# Patient Record
Sex: Male | Born: 1985
Health system: Southern US, Community
[De-identification: ages and names within clinical notes are randomized; demographics above are authoritative.]

## PROBLEM LIST (undated history)

## (undated) DIAGNOSIS — F329 Major depressive disorder, single episode, unspecified: Secondary | ICD-10-CM

## (undated) DIAGNOSIS — Z720 Tobacco use: Secondary | ICD-10-CM

## (undated) DIAGNOSIS — F32A Depression, unspecified: Secondary | ICD-10-CM

## (undated) DIAGNOSIS — M1990 Unspecified osteoarthritis, unspecified site: Secondary | ICD-10-CM

## (undated) DIAGNOSIS — G4700 Insomnia, unspecified: Secondary | ICD-10-CM

## (undated) HISTORY — DX: Major depressive disorder, single episode, unspecified: F32.9

## (undated) HISTORY — DX: Insomnia, unspecified: G47.00

## (undated) HISTORY — DX: Unspecified osteoarthritis, unspecified site: M19.90

## (undated) HISTORY — DX: Tobacco use: Z72.0

## (undated) HISTORY — DX: Depression, unspecified: F32.A

---

## 2017-01-14 ENCOUNTER — Encounter: Payer: Self-pay | Admitting: Primary Care

## 2017-01-14 ENCOUNTER — Encounter (INDEPENDENT_AMBULATORY_CARE_PROVIDER_SITE_OTHER): Payer: Self-pay

## 2017-01-14 ENCOUNTER — Ambulatory Visit (INDEPENDENT_AMBULATORY_CARE_PROVIDER_SITE_OTHER): Payer: BLUE CROSS/BLUE SHIELD | Admitting: Primary Care

## 2017-01-14 VITALS — BP 122/82 | HR 69 | Temp 98.3°F | Ht 69.75 in | Wt 172.1 lb

## 2017-01-14 DIAGNOSIS — F419 Anxiety disorder, unspecified: Secondary | ICD-10-CM | POA: Diagnosis not present

## 2017-01-14 DIAGNOSIS — M2550 Pain in unspecified joint: Secondary | ICD-10-CM | POA: Insufficient documentation

## 2017-01-14 DIAGNOSIS — G8929 Other chronic pain: Secondary | ICD-10-CM | POA: Diagnosis not present

## 2017-01-14 DIAGNOSIS — F32A Depression, unspecified: Secondary | ICD-10-CM | POA: Insufficient documentation

## 2017-01-14 DIAGNOSIS — M5440 Lumbago with sciatica, unspecified side: Secondary | ICD-10-CM | POA: Diagnosis not present

## 2017-01-14 DIAGNOSIS — F329 Major depressive disorder, single episode, unspecified: Secondary | ICD-10-CM

## 2017-01-14 NOTE — Patient Instructions (Addendum)
Stop by the front desk and speak with either Shirlee Limerick or Zella Ball regarding your MRI.  Continue to alternate between Ibuprofen and Tylenol as needed for pain.  I will be in touch with you once I receive your MRI results.  It was a pleasure to meet you today! Please don't hesitate to call me with any questions. Welcome to [facility]!

## 2017-01-14 NOTE — Assessment & Plan Note (Signed)
[facility], managed on Lexapro through [place]. He is scheduled for follow up in November 2018.  Denies SI/HI.

## 2017-01-14 NOTE — Assessment & Plan Note (Signed)
Present for months, xray unremarkable per [place], no improvement with physical therapy. Exam today concerning for weakness/foot drop to right lower extremity. Will obtain MRI for further evaluation, consider neurosurgery referral.

## 2017-01-14 NOTE — Progress Notes (Signed)
Subjective:    Patient ID: Steve Lopez, male    DOB: 1986/01/05, 31 y.o.   MRN: [id number]  HPI  Steve Lopez is a 31 year old male who presents today to establish care and discuss the problems mentioned below. Will obtain old records. He follows with the [place].   1) Anxiety and Depression: Also with Insomnia. Diagnosed in Lopez 2017. Currently managed on Lexapro 10 mg. He has difficulty falling and staying asleep. He's taking Melatonin 10 mg at bedtime and will typically wake up four hours later. He will sleep better when he misses a dose of his Lexapro. He is currently following with the [place].  2) Chronic Back Pain: Decrease ROM and pain when rising from a sitting position. It takes him seveaal seconds to minutes to improve mobility. He also experiences bilateral low back pain with radiation to his lower extremities, intermittent numbness. He had xray's of his lower back through the [place] which were unremarkable. He is currently undergoing physical therapy for which he began in Lopez 2017. He's noticed minimal improvement with physical therapy. His pain is worse with activity, working outdoors, and during his occupation. He has noticed weakness to where he has difficulty picking up his right ankle and foot when walking.  3) Knee Pain: Located to bilateral knees that have been present for the past 8-10 years. Pain is worse with activity. He has chronic crepitus. He takes tylenol/ibuprofen three times daily for pain, 3-4 days weekly on average. He has noticed weakness to his left knee. He denies recent injury/trauma.  4) Ankle Pain: Located to the right posterior ankle with radiation to anterior side. This is intermittent since rolling his ankle in 2006. He has noticed weakness to where he cannot pick up his foot on occasion. He denies recent injury/trauma.  5) Shoulder Pain: Located to his left shoulder since exercising in 2007. He experiences intermittent decrease in ROM and pain. He denies  numbness/tingling to the left shoulder. He denies prior dislocation.   6) Elbow Pain: Crepitus to left elbow with numbness that originates to the left elbow that will radiate to the finger tips. This has been intermittent daily since 2008.   Review of Systems  Constitutional: Negative for unexpected weight change.  Respiratory: Negative for shortness of breath.   Cardiovascular: Negative for chest pain.  Musculoskeletal: Positive for arthralgias and back pain.  Skin: Negative for color change.  Neurological: Positive for weakness and numbness.  Psychiatric/Behavioral: Positive for sleep disturbance. Negative for suicidal ideas. The patient is not nervous/anxious.        Past Medical History:  Diagnosis Date  . Arthritis   . Depression   . Insomnia      Social History   Social History  . Marital status: Married    Spouse name: N/A  . Number of children: N/A  . Years of education: N/A   Occupational History  . Not on file.   Social History Main Topics  . Smoking status: Former Smoker    Types: Cigarettes  . Smokeless tobacco: Never Used  . Alcohol use No  . Drug use: No  . Sexual activity: Not on file   Other Topics Concern  . Not on file   Social History Narrative   Soon to be married.   Three children.   Works as a Armed forces training and education officer.    Enjoys spending time outdoors, working on cars.    No past surgical history on file.  No family history on file.  No Known Allergies  No current outpatient prescriptions on file prior to visit.   No current facility-administered medications on file prior to visit.     BP 122/82   Pulse 69   Temp 98.3 F (36.8 C) (Oral)   Ht 5' 9.75" (1.772 m)   Wt 172 lb 1.9 oz (78.1 kg)   SpO2 98%   BMI 24.87 kg/m    Objective:   Physical Exam  Constitutional: He appears well-nourished.  Neck: Neck supple.  Cardiovascular: Normal rate and regular rhythm.   Pulmonary/Chest: Effort normal and breath sounds normal.    Musculoskeletal:       Left shoulder: He exhibits crepitus. He exhibits normal range of motion, no tenderness and no pain.       Left elbow: He exhibits normal range of motion and no deformity. No tenderness found.       Right knee: He exhibits normal range of motion and no bony tenderness.       Left knee: He exhibits normal range of motion and no bony tenderness.       Right ankle: He exhibits normal range of motion. No tenderness.       Lumbar back: He exhibits decreased range of motion and pain. He exhibits no tenderness and no bony tenderness.  Weakness to right foot with plantar/dorsal flexion and extension. 4/5.  Skin: Skin is warm and dry.  Psychiatric: He has a normal mood and affect.          Assessment & Plan:

## 2017-01-14 NOTE — Assessment & Plan Note (Addendum)
Chronic for 8-10 years. Located to knees, right ankle, left elbow, left shoulder. Exam stable. Continue physical therapy through the [place]. Continue tylenol/ibuprofen PRN. Consider ortho evaluation in the future.

## 2017-01-14 NOTE — Progress Notes (Signed)
Pre visit review using our clinic review tool, if applicable. No additional management support is needed unless otherwise documented below in the visit note. 

## 2017-01-23 ENCOUNTER — Ambulatory Visit
Admission: RE | Admit: 2017-01-23 | Discharge: 2017-01-23 | Disposition: A | Discharge status: Home / Self Care | Payer: BLUE CROSS/BLUE SHIELD | Source: Ambulatory Visit | Attending: Primary Care | Admitting: Primary Care

## 2017-01-23 DIAGNOSIS — M5126 Other intervertebral disc displacement, lumbar region: Secondary | ICD-10-CM | POA: Insufficient documentation

## 2017-01-23 DIAGNOSIS — M47896 Other spondylosis, lumbar region: Secondary | ICD-10-CM | POA: Diagnosis not present

## 2017-01-23 DIAGNOSIS — M545 Low back pain: Secondary | ICD-10-CM | POA: Diagnosis not present

## 2017-01-23 DIAGNOSIS — G8929 Other chronic pain: Secondary | ICD-10-CM | POA: Diagnosis not present

## 2017-01-23 DIAGNOSIS — M5440 Lumbago with sciatica, unspecified side: Secondary | ICD-10-CM | POA: Insufficient documentation

## 2017-05-28 DIAGNOSIS — H52223 Regular astigmatism, bilateral: Secondary | ICD-10-CM | POA: Diagnosis not present

## 2017-05-28 DIAGNOSIS — H5213 Myopia, bilateral: Secondary | ICD-10-CM | POA: Diagnosis not present

## 2017-06-07 ENCOUNTER — Other Ambulatory Visit: Payer: Self-pay | Admitting: Primary Care

## 2017-06-07 DIAGNOSIS — Z0000 Encounter for general adult medical examination without abnormal findings: Secondary | ICD-10-CM

## 2017-06-10 ENCOUNTER — Other Ambulatory Visit (INDEPENDENT_AMBULATORY_CARE_PROVIDER_SITE_OTHER): Payer: BLUE CROSS/BLUE SHIELD

## 2017-06-10 DIAGNOSIS — Z0000 Encounter for general adult medical examination without abnormal findings: Secondary | ICD-10-CM | POA: Diagnosis not present

## 2017-06-10 LAB — LIPID PANEL
CHOL/HDL RATIO: 4
Cholesterol: 120 mg/dL (ref 0–200)
HDL: 27.7 mg/dL — ABNORMAL LOW (ref >39.00)
LDL Cholesterol: 62 mg/dL (ref 0–99)
NONHDL: 92.24
Triglycerides: 153 mg/dL — ABNORMAL HIGH (ref 0.0–149.0)
VLDL: 30.6 mg/dL (ref 0.0–40.0)

## 2017-06-10 LAB — COMPREHENSIVE METABOLIC PANEL
ALK PHOS: 51 U/L (ref 39–117)
ALT: 42 U/L (ref 0–53)
AST: 20 U/L (ref 0–37)
Albumin: 4.7 g/dL (ref 3.5–5.2)
BILIRUBIN TOTAL: 0.4 mg/dL (ref 0.2–1.2)
BUN: 12 mg/dL (ref 6–23)
CO2: 28 meq/L (ref 19–32)
Calcium: 9.7 mg/dL (ref 8.4–10.5)
Chloride: 106 mEq/L (ref 96–112)
Creatinine, Ser: 0.88 mg/dL (ref 0.40–1.50)
GFR: 107.59 mL/min (ref >60.00)
GLUCOSE: 75 mg/dL (ref 70–99)
Potassium: 3.5 mEq/L (ref 3.5–5.1)
SODIUM: 142 meq/L (ref 135–145)
TOTAL PROTEIN: 7.3 g/dL (ref 6.0–8.3)

## 2017-06-11 ENCOUNTER — Ambulatory Visit (INDEPENDENT_AMBULATORY_CARE_PROVIDER_SITE_OTHER): Payer: BLUE CROSS/BLUE SHIELD | Admitting: Primary Care

## 2017-06-11 ENCOUNTER — Encounter: Payer: Self-pay | Admitting: Primary Care

## 2017-06-11 VITALS — BP 120/78 | HR 79 | Temp 97.9°F | Ht 69.75 in | Wt 169.0 lb

## 2017-06-11 DIAGNOSIS — Z0000 Encounter for general adult medical examination without abnormal findings: Secondary | ICD-10-CM

## 2017-06-11 DIAGNOSIS — F419 Anxiety disorder, unspecified: Secondary | ICD-10-CM | POA: Diagnosis not present

## 2017-06-11 DIAGNOSIS — F329 Major depressive disorder, single episode, unspecified: Secondary | ICD-10-CM

## 2017-06-11 DIAGNOSIS — F32A Depression, unspecified: Secondary | ICD-10-CM

## 2017-06-11 NOTE — Assessment & Plan Note (Signed)
Td UTD per patient via [place]. Declines influenza vaccination. Discussed to increase vegetables, fruit, whole grains. Discussed to start exercising. Exam unremarkable. Labs stable.  Follow up in 1 year.

## 2017-06-11 NOTE — Assessment & Plan Note (Signed)
No Lexapro since July 2018, doing well off. Continue to monitor. Managed on Ambien now for insomnia due to occupation as he works night shift. Managed per the [place].

## 2017-06-11 NOTE — Patient Instructions (Signed)
Start exercising. You should be getting 150 minutes of moderate intensity exercise weekly.  Increase consumption of vegetables, fruit, whole grains.  Ensure you are consuming 64 ounces of water daily.  Follow up in 1 year for your annual exam or sooner if needed.  It was a pleasure to see you today!

## 2017-06-11 NOTE — Progress Notes (Signed)
Subjective:    Patient ID: Steve Lopez, male    DOB: [birth year], 31 y.o.   MRN: [id number]  HPI  Steve Lopez is a 31 year old male who presents today for complete physical.  Immunizations: -Tetanus: Believes it's been within the last 10 years. -Influenza: Declines   Diet: He endorse a healthy diet. Breakfast: Protein shake Lunch: Left overs  Dinner: Chicken, pasta, meat, vegetables  Snacks: None Desserts: None Beverages: Green tea, low calorie Gatorade, water  Exercise: He does not currently exercise. Eye exam: Completed in September 2018 Dental exam: Due tomorrow.   Review of Systems  Constitutional: Negative for unexpected weight change.  HENT: Negative for rhinorrhea.   Respiratory: Negative for cough and shortness of breath.   Cardiovascular: Negative for chest pain.  Gastrointestinal: Negative for constipation and diarrhea.  Genitourinary: Negative for difficulty urinating.  Musculoskeletal: Negative for arthralgias and myalgias.  Skin: Negative for rash.  Allergic/Immunologic: Negative for environmental allergies.  Neurological: Negative for dizziness, numbness and headaches.  Psychiatric/Behavioral:       Stopped Lexapro in July 2018, doing well off of Lexapro.        Past Medical History:  Diagnosis Date  . Arthritis   . Depression   . Insomnia      Social History   Social History  . Marital status: Married    Spouse name: N/A  . Number of children: N/A  . Years of education: N/A   Occupational History  . Not on file.   Social History Main Topics  . Smoking status: Former Smoker    Types: Cigarettes  . Smokeless tobacco: Never Used  . Alcohol use No  . Drug use: No  . Sexual activity: Not on file   Other Topics Concern  . Not on file   Social History Narrative   Soon to be married.   Three children.   Works as a Armed forces training and education officer.    Enjoys spending time outdoors, working on cars.    No past surgical history on file.  No  family history on file.  No Known Allergies  No current outpatient prescriptions on file prior to visit.   No current facility-administered medications on file prior to visit.     BP 120/78   Pulse 79   Temp 97.9 F (36.6 C) (Oral)   Ht 5' 9.75" (1.772 m)   Wt 169 lb (76.7 kg)   SpO2 97%   BMI 24.42 kg/m    Objective:   Physical Exam  Constitutional: He is oriented to person, place, and time. He appears well-nourished.  HENT:  Right Ear: Tympanic membrane and ear canal normal.  Left Ear: Tympanic membrane and ear canal normal.  Nose: Nose normal. Right sinus exhibits no maxillary sinus tenderness and no frontal sinus tenderness. Left sinus exhibits no maxillary sinus tenderness and no frontal sinus tenderness.  Mouth/Throat: Oropharynx is clear and moist.  Eyes: Pupils are equal, round, and reactive to light. Conjunctivae and EOM are normal.  Neck: Neck supple. Carotid bruit is not present. No thyromegaly present.  Cardiovascular: Normal rate, regular rhythm and normal heart sounds.   Pulmonary/Chest: Effort normal and breath sounds normal. He has no wheezes. He has no rales.  Abdominal: Soft. Bowel sounds are normal. There is no tenderness.  Musculoskeletal: Normal range of motion.  Neurological: He is alert and oriented to person, place, and time. He has normal reflexes. No cranial nerve deficit.  Skin: Skin is warm and dry.  Psychiatric: He has  a normal mood and affect.          Assessment & Plan:

## 2018-03-10 IMAGING — MR MR LUMBAR SPINE W/O CM
4 of 5 series · 15 of 48 positions shown · non-contrast
Comparison: None.

CLINICAL DATA: Low back pain and bilateral leg pain with numbness
and tingling. Symptoms of 10 years duration.

EXAM:
MRI LUMBAR SPINE WITHOUT CONTRAST
TECHNIQUE: Multiplanar, multisequence MR imaging of the lumbar spine was
performed. No intravenous contrast was administered.

[Series 2: T2 · sagittal · 4.0mm · 0.44mm/px · 6 of 15 slices shown (1 of 2)]
[im 1/15]
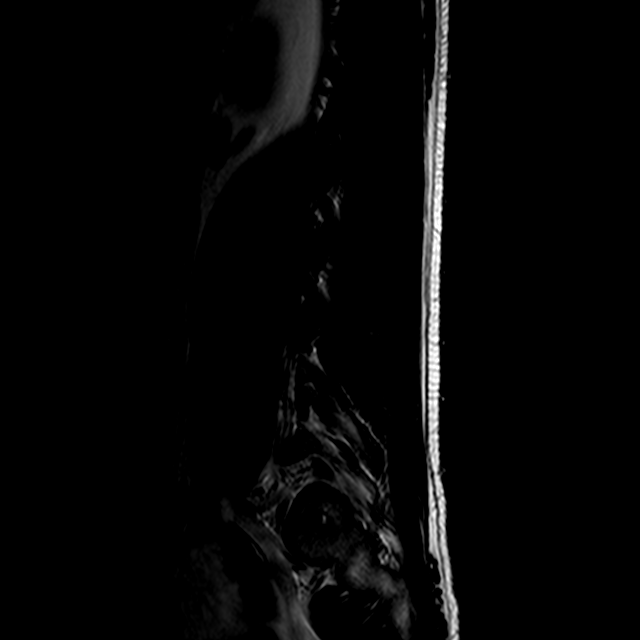
[im 3/15]
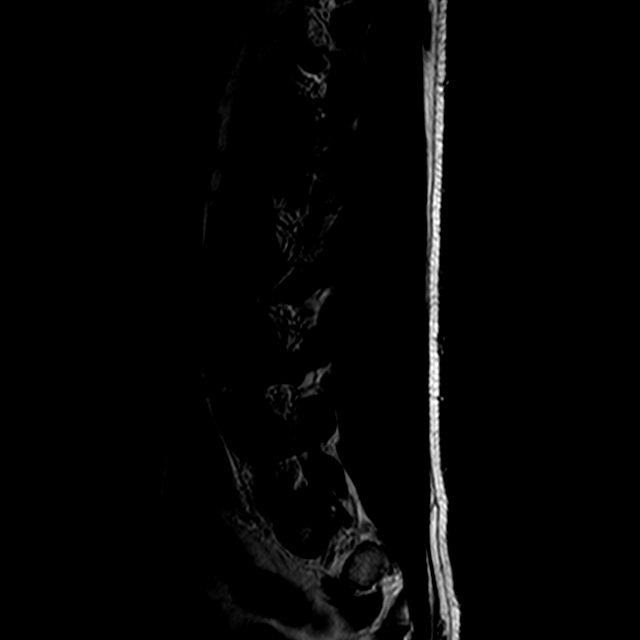
[im 6/15]
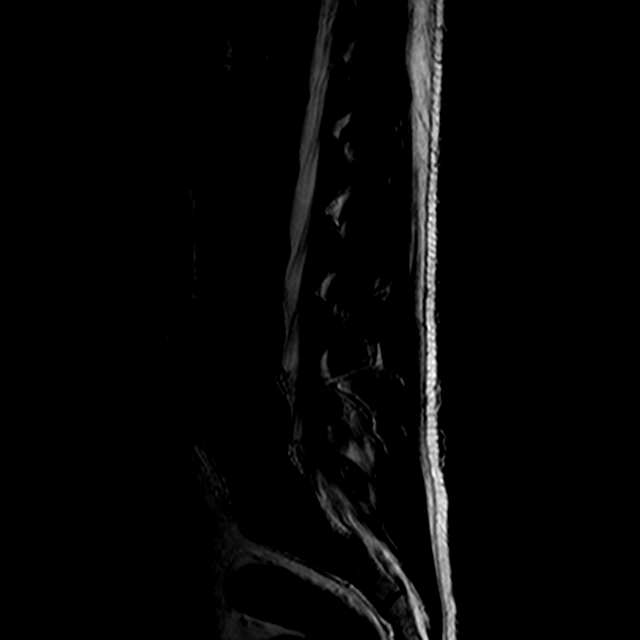
[im 9/15]
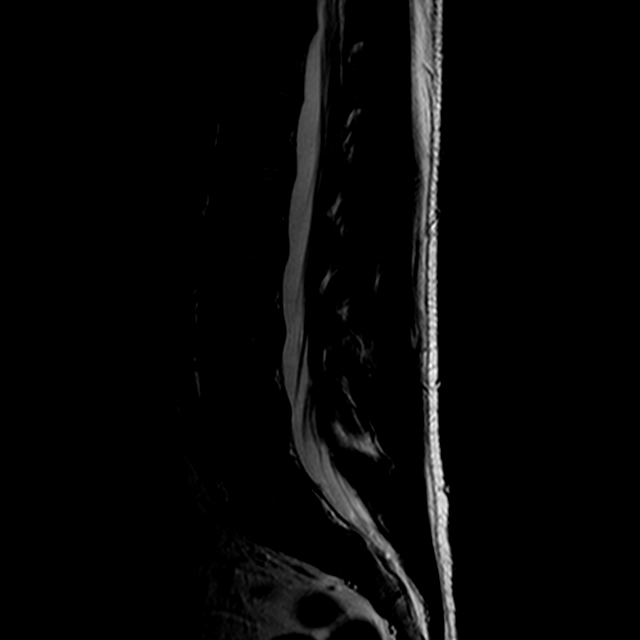
[im 12/15]
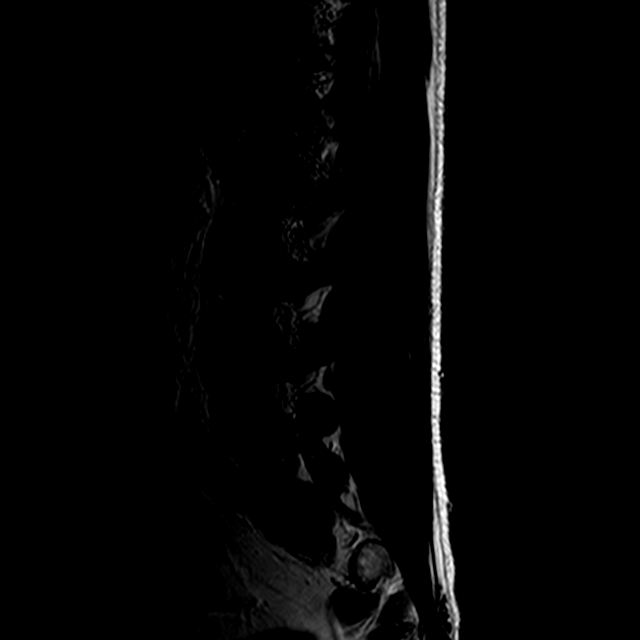
[im 15/15]
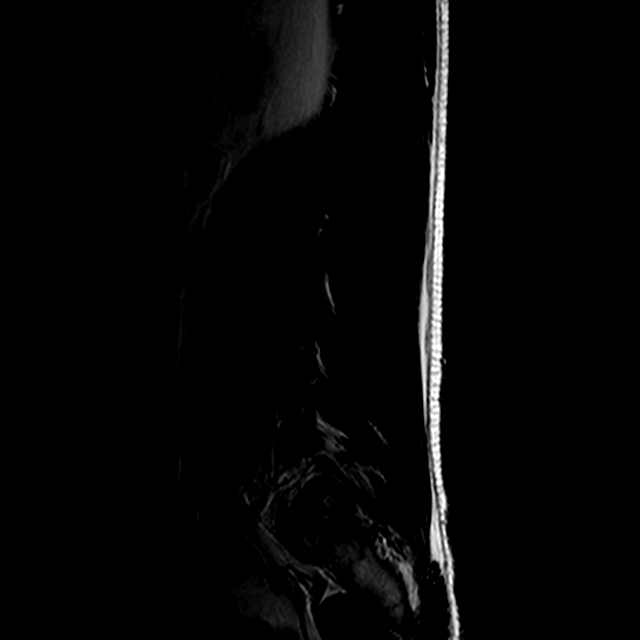

[Series 3: T1 · sagittal · 4.0mm · 0.44mm/px · 3 of 15 slices shown (1 of 2)]
[im 3/15]
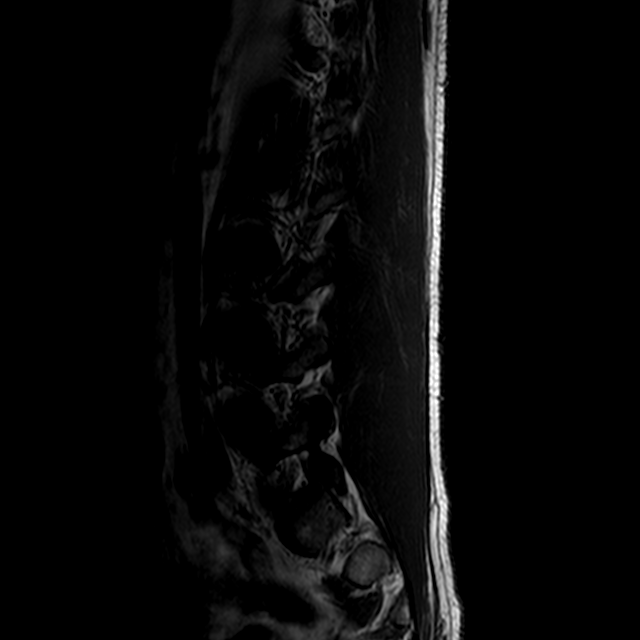
[im 9/15]
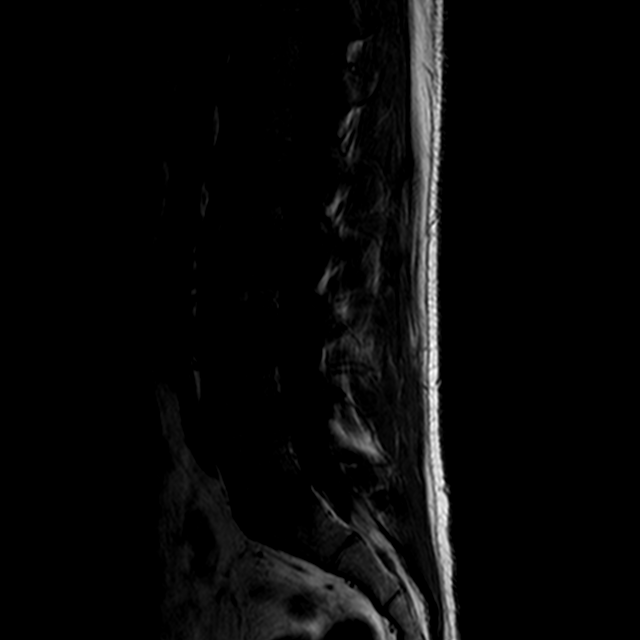
[im 15/15]
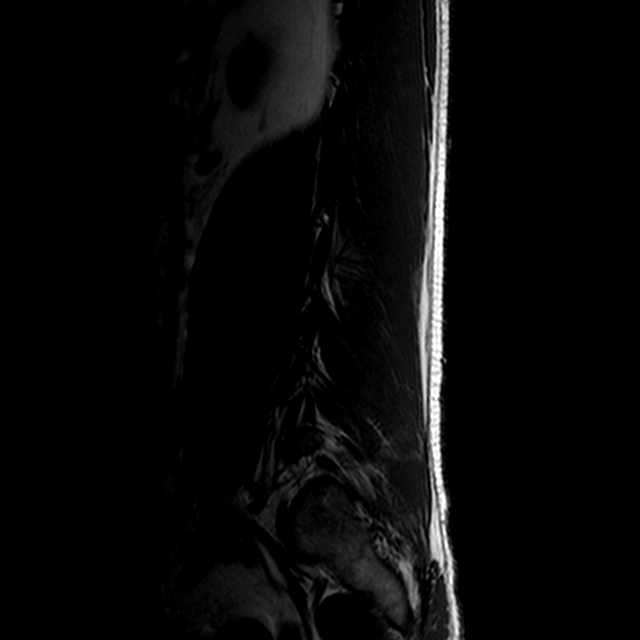

[Series 5: T2 · axial · 4.0mm · 0.39mm/px · z∈[-78,+89]mm · 3 of 39 slices shown (2 of 2)]
[im 6/39]
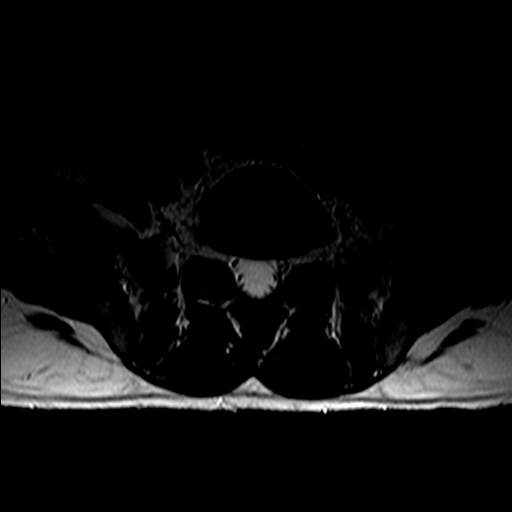
[im 20/39]
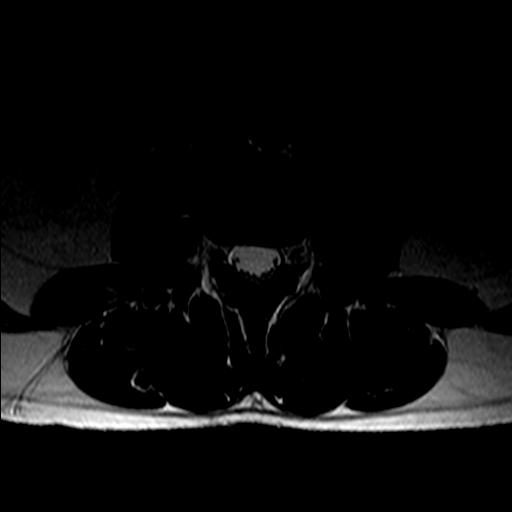
[im 33/39]
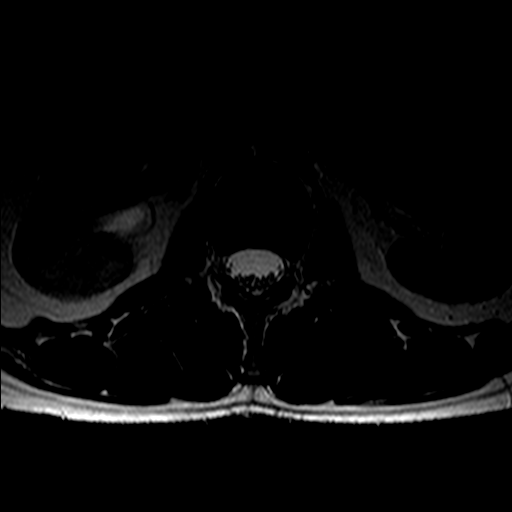

[Series 6: T1 · axial · 4.0mm · 0.39mm/px · z∈[-78,+89]mm · 3 of 39 slices shown (2 of 2)]
[im 6/39]
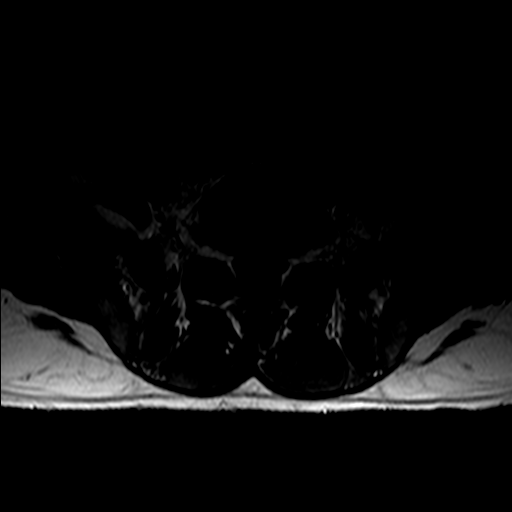
[im 20/39]
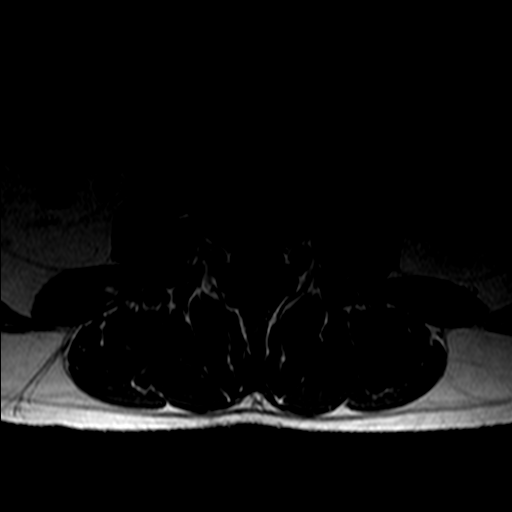
[im 33/39]
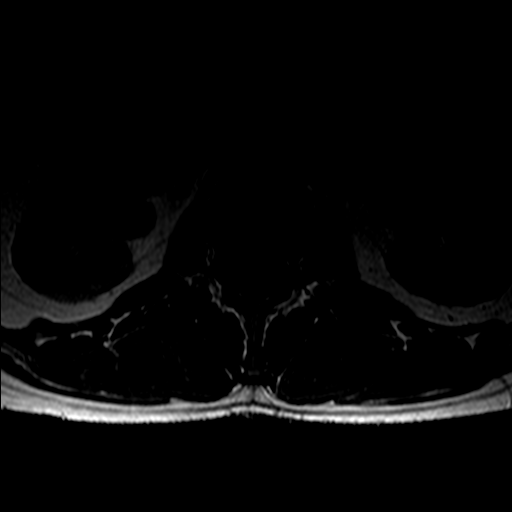

[15 of 48 positions shown; findings below may reference images not displayed]

FINDINGS: Segmentation: 5 lumbar type vertebral bodies. L5 has some
transitional features.

Alignment:  Normal

Vertebrae:  Normal

Conus medullaris: Extends to the L1 level and appears normal.

Paraspinal and other soft tissues: Normal

Disc levels:

T11-12: Minimal bulging of the disc. No stenosis or neural
compression.

T12-L1 through L3-4. Normal. No disc pathology. No facet
arthropathy. No stenosis.

L4-5: Very minimal bulging of the disc. Minimal facet
osteoarthritis. No compressive stenosis.

L5-S1: Somewhat rudimentary disc space. Normal signal
characteristics and morphology. No stenosis.
IMPRESSION: L5 has some transitional features.

Very minimal degenerative changes at L4-5 with minimal bulging of
the disc and minimal facet osteoarthritis. No compressive stenosis.
Often, findings of this degree will be asymptomatic, but it is
possible they could contribute to low back pain. No evidence of
neural compression.

## 2018-04-28 ENCOUNTER — Ambulatory Visit: Payer: BLUE CROSS/BLUE SHIELD | Admitting: Primary Care

## 2018-04-28 ENCOUNTER — Encounter: Payer: Self-pay | Admitting: Primary Care

## 2018-04-28 VITALS — BP 122/84 | HR 72 | Temp 98.0°F | Ht 69.75 in | Wt 172.2 lb

## 2018-04-28 DIAGNOSIS — G8929 Other chronic pain: Secondary | ICD-10-CM

## 2018-04-28 DIAGNOSIS — Z720 Tobacco use: Secondary | ICD-10-CM | POA: Diagnosis not present

## 2018-04-28 DIAGNOSIS — F329 Major depressive disorder, single episode, unspecified: Secondary | ICD-10-CM

## 2018-04-28 DIAGNOSIS — F419 Anxiety disorder, unspecified: Secondary | ICD-10-CM | POA: Diagnosis not present

## 2018-04-28 DIAGNOSIS — M5440 Lumbago with sciatica, unspecified side: Secondary | ICD-10-CM | POA: Diagnosis not present

## 2018-04-28 DIAGNOSIS — F32A Depression, unspecified: Secondary | ICD-10-CM

## 2018-04-28 DIAGNOSIS — Z0000 Encounter for general adult medical examination without abnormal findings: Secondary | ICD-10-CM

## 2018-04-28 LAB — COMPREHENSIVE METABOLIC PANEL
ALBUMIN: 4.7 g/dL (ref 3.5–5.2)
ALT: 82 U/L — ABNORMAL HIGH (ref 0–53)
AST: 33 U/L (ref 0–37)
Alkaline Phosphatase: 69 U/L (ref 39–117)
BUN: 14 mg/dL (ref 6–23)
CALCIUM: 10 mg/dL (ref 8.4–10.5)
CO2: 31 meq/L (ref 19–32)
CREATININE: 1.08 mg/dL (ref 0.40–1.50)
Chloride: 102 mEq/L (ref 96–112)
GFR: 84.46 mL/min (ref >60.00)
Glucose, Bld: 100 mg/dL — ABNORMAL HIGH (ref 70–99)
POTASSIUM: 4.5 meq/L (ref 3.5–5.1)
SODIUM: 138 meq/L (ref 135–145)
Total Bilirubin: 0.6 mg/dL (ref 0.2–1.2)
Total Protein: 7.6 g/dL (ref 6.0–8.3)

## 2018-04-28 LAB — LIPID PANEL
CHOL/HDL RATIO: 5
CHOLESTEROL: 123 mg/dL (ref 0–200)
HDL: 25.4 mg/dL — ABNORMAL LOW (ref >39.00)
NONHDL: 97.25
TRIGLYCERIDES: 248 mg/dL — AB (ref 0.0–149.0)
VLDL: 49.6 mg/dL — AB (ref 0.0–40.0)

## 2018-04-28 LAB — LDL CHOLESTEROL, DIRECT: Direct LDL: 88 mg/dL

## 2018-04-28 MED ORDER — VARENICLINE TARTRATE 1 MG PO TABS: 1.0000 mg | ORAL_TABLET | Freq: Two times a day (BID) | ORAL | 0 refills | Status: DC

## 2018-04-28 MED ORDER — VARENICLINE TARTRATE 0.5 MG X 11 & 1 MG X 42 PO MISC: ORAL | 0 refills | Status: DC

## 2018-04-28 NOTE — Assessment & Plan Note (Signed)
Participating in PT for knee and shoulder, following with [facility].

## 2018-04-28 NOTE — Assessment & Plan Note (Signed)
Once on cigarettes, now vaping.  Will trial Chantix as he's been successful on this in the past. No interaction with Tofranil.   Will send starting and continuing packs to pharmacy, he will update once he's towards then end of his continuing pack.

## 2018-04-28 NOTE — Progress Notes (Signed)
Subjective:    Patient ID: Steve Lopez, male    DOB: 25-Dec-1985, 32 y.o.   MRN: [id number]  HPI  Steve Lopez is a 32 year old male who presents today for complete physical.  Currently vaping 2 cartriges within 12 hour shifts, doing this daily. He has tried weaning down on his dose but has not been successful. He's wanting to quit and needs help. He's tried nicotine patches without improvement. He has tried Chantix for cigarettes in the past and was successful.  Immunizations: -Tetanus: Completed within 10 years.  -Influenza: Did not complete last season   Diet: He endorses a healthy diet. Breakfast: Bagel, eggs, bacon Lunch: Chicken, pasta, vegetables, starch Dinner: Chicken, pasta, vegetables, starch Snacks: None Desserts: None Beverages: Green tea, Gatorade 2, water  Exercise: He's been doing kickboxing and martial arts twice weekly Eye exam: Completed in 2019 Dental exam: Completes semi-annually    Review of Systems  Constitutional: Negative for unexpected weight change.  HENT: Negative for rhinorrhea.   Respiratory: Negative for cough and shortness of breath.   Cardiovascular: Negative for chest pain.  Gastrointestinal: Negative for constipation and diarrhea.  Genitourinary: Negative for difficulty urinating.  Musculoskeletal: Positive for arthralgias and back pain.  Skin: Negative for rash.  Allergic/Immunologic: Negative for environmental allergies.  Neurological: Negative for dizziness, numbness and headaches.  Psychiatric/Behavioral:       Overall feeling stable.        Past Medical History:  Diagnosis Date  . Arthritis   . Depression   . Insomnia      Social History   Socioeconomic History  . Marital status: Married    Spouse name: Not on file  . Number of children: Not on file  . Years of education: Not on file  . Highest education level: Not on file  Occupational History  . Not on file  Social Needs  . Financial resource strain: Not on file    . Food insecurity:    Worry: Not on file    Inability: Not on file  . Transportation needs:    Medical: Not on file    Non-medical: Not on file  Tobacco Use  . Smoking status: Former Smoker    Types: Cigarettes  . Smokeless tobacco: Never Used  Substance and Sexual Activity  . Alcohol use: No  . Drug use: No  . Sexual activity: Not on file  Lifestyle  . Physical activity:    Days per week: Not on file    Minutes per session: Not on file  . Stress: Not on file  Relationships  . Social connections:    Talks on phone: Not on file    Gets together: Not on file    Attends religious service: Not on file    Active member of club or organization: Not on file    Attends meetings of clubs or organizations: Not on file    Relationship status: Not on file  . Intimate partner violence:    Fear of current or ex partner: Not on file    Emotionally abused: Not on file    Physically abused: Not on file    Forced sexual activity: Not on file  Other Topics Concern  . Not on file  Social History Narrative   Soon to be married.   Three children.   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    Enjoys spending time outdoors, working on cars.    No family history on file.  No Known Allergies  Current Outpatient Medications on File Prior to Visit  Medication Sig Dispense Refill  . imipramine (TOFRANIL) 10 MG tablet Take 50 mg by mouth at bedtime.    . Cholecalciferol (VITAMIN D PO) Take 5,000 Units by mouth daily.     No current facility-administered medications on file prior to visit.     BP 122/84   Pulse 72   Temp 98 F (36.7 C) (Oral)   Ht 5' 9.75" (1.772 m)   Wt 172 lb 4 oz (78.1 kg)   SpO2 98%   BMI 24.89 kg/m    Objective:   Physical Exam  Constitutional: He is oriented to person, place, and time. He appears well-nourished.  HENT:  Mouth/Throat: No oropharyngeal exudate.  Eyes: Pupils are equal, round, and reactive to light. EOM are normal.  Neck: Neck supple. No  thyromegaly present.  Cardiovascular: Normal rate and regular rhythm.  Respiratory: Effort normal and breath sounds normal.  GI: Soft. Bowel sounds are normal. There is no tenderness.  Musculoskeletal: Normal range of motion.  Neurological: He is alert and oriented to person, place, and time.  Skin: Skin is warm and dry.  Psychiatric: He has a normal mood and affect.           Assessment & Plan:

## 2018-04-28 NOTE — Assessment & Plan Note (Signed)
Immunizations UTD. Encouraged a healthy diet and regular exercise. Exam unremarkable. Labs pending. Follow up in 1 year for CPE

## 2018-04-28 NOTE — Assessment & Plan Note (Signed)
No longer on Ambien, now on imipramine 50 mg per the [facility]. Overall doing okay. Denies SI/HI.

## 2018-04-28 NOTE — Patient Instructions (Addendum)
Continue exercising. You should be getting 150 minutes of moderate intensity exercise weekly.  Continue to work on a healthy diet.  Ensure you are consuming 64 ounces of water daily.  Stop by the lab prior to leaving today. I will notify you of your results once received.   Start Chantix Starting Pack, then proceed to the Continuing Pack as discussed. Pick a quit date before you start the pack. Please update me when you are getting towards the end of your continuing pack.   Follow up in 1 year for your annual exam or sooner if needed.  It was a pleasure to see you today!

## 2018-05-15 ENCOUNTER — Telehealth: Payer: Self-pay

## 2018-05-15 ENCOUNTER — Encounter: Payer: Self-pay | Admitting: Internal Medicine

## 2018-05-15 ENCOUNTER — Ambulatory Visit (INDEPENDENT_AMBULATORY_CARE_PROVIDER_SITE_OTHER)
Admission: RE | Admit: 2018-05-15 | Discharge: 2018-05-15 | Disposition: A | Discharge status: Home / Self Care | Payer: No Typology Code available for payment source | Source: Ambulatory Visit | Attending: Internal Medicine | Admitting: Internal Medicine

## 2018-05-15 ENCOUNTER — Ambulatory Visit (INDEPENDENT_AMBULATORY_CARE_PROVIDER_SITE_OTHER): Payer: BLUE CROSS/BLUE SHIELD | Admitting: Internal Medicine

## 2018-05-15 VITALS — BP 122/82 | HR 79 | Temp 98.0°F | Wt 176.0 lb

## 2018-05-15 DIAGNOSIS — M542 Cervicalgia: Secondary | ICD-10-CM

## 2018-05-15 MED ORDER — CYCLOBENZAPRINE HCL 5 MG PO TABS: 5.0000 mg | ORAL_TABLET | Freq: Every day | ORAL | 1 refills | Status: DC

## 2018-05-15 NOTE — Telephone Encounter (Signed)
Copied from CRM [id number]. Topic: General - Other >> May 15, 2018  8:24 AM Leafy Roobinson, Norma J wrote: Reason for CRM: pt saw katherine on 04/28/18 and was told to call back with the date he quit smoking. That date is 05/06/18

## 2018-05-15 NOTE — Telephone Encounter (Signed)
Wonderful news!!! Please tell him that I'm very to happy to hear of this news! He should continue with the Chantix continuing pac and notify me if he feels he needs a refill towards then end of the pack.

## 2018-05-15 NOTE — Progress Notes (Signed)
Subjective:    Patient ID: Steve Lopez, male    DOB: 1986/08/28, 32 y.o.   MRN: [id number]  HPI  Pt presents to the clinic today with c/o neck pain. He reports this started 2 weeks ago. He reports he was playing with his kids, and felt an "electric shock". He describes the pain as achy but sharp with certain movements. The pain is worse with movement. He reports he hears a grinding noise when he turns his head to the right. He denies numbness, tingling or weakness of the upper extremities. He has tried Aleve and a heating pad with minimal relief.   Review of Systems      Past Medical History:  Diagnosis Date  . Arthritis   . Depression   . Insomnia   . Tobacco abuse     Current Outpatient Medications  Medication Sig Dispense Refill  . Cholecalciferol (VITAMIN D PO) Take 5,000 Units by mouth daily.    Marland Kitchen. imipramine (TOFRANIL) 10 MG tablet Take 50 mg by mouth at bedtime.    . varenicline (CHANTIX CONTINUING MONTH PAK) 1 MG tablet Take 1 tablet (1 mg total) by mouth 2 (two) times daily. 60 tablet 0  . varenicline (CHANTIX STARTING MONTH PAK) 0.5 MG X 11 & 1 MG X 42 tablet Take one 0.5 mg once daily for 3 days, then increase to 0.5 mg twice daily for 4 days, then increase to 1 mg twice daily. 53 tablet 0   No current facility-administered medications for this visit.     No Known Allergies  No family history on file.  Social History   Socioeconomic History  . Marital status: Married    Spouse name: Not on file  . Number of children: Not on file  . Years of education: Not on file  . Highest education level: Not on file  Occupational History  . Not on file  Social Needs  . Financial resource strain: Not on file  . Food insecurity:    Worry: Not on file    Inability: Not on file  . Transportation needs:    Medical: Not on file    Non-medical: Not on file  Tobacco Use  . Smoking status: Former Smoker    Types: Cigarettes  . Smokeless tobacco: Never Used  Substance  and Sexual Activity  . Alcohol use: No  . Drug use: No  . Sexual activity: Not on file  Lifestyle  . Physical activity:    Days per week: Not on file    Minutes per session: Not on file  . Stress: Not on file  Relationships  . Social connections:    Talks on phone: Not on file    Gets together: Not on file    Attends religious service: Not on file    Active member of club or organization: Not on file    Attends meetings of clubs or organizations: Not on file    Relationship status: Not on file  . Intimate partner violence:    Fear of current or ex partner: Not on file    Emotionally abused: Not on file    Physically abused: Not on file    Forced sexual activity: Not on file  Other Topics Concern  . Not on file  Social History Narrative   Soon to be married.   Three children.   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    Enjoys spending time outdoors, working on cars.     Constitutional: Denies fever, malaise,  fatigue, headache or abrupt weight changes.  Musculoskeletal: Pt reports neck pain. Denies decrease in range of motion, difficulty with gait, or joint  swelling.  Neurological: Denies dizziness, difficulty with memory, difficulty with speech or problems with balance and coordination.    No other specific complaints in a complete review of systems (except as listed in HPI above).  Objective:   Physical Exam  BP 122/82   Pulse 79   Temp 98 F (36.7 C) (Oral)   Wt 176 lb (79.8 kg)   SpO2 98%   BMI 25.43 kg/m  Wt Readings from Last 3 Encounters:  05/15/18 176 lb (79.8 kg)  04/28/18 172 lb 4 oz (78.1 kg)  06/11/17 169 lb (76.7 kg)    General: Appears his stated age, well developed, well nourished in NAD. Musculoskeletal: Decreased flexion and extension of the cervical spine. Normal rotation. Mild tenderness to palpation over the cervical spine. Strength 5/5 BUE/BLE. Hand grips equal. Neurological: Alert and oriented. Sensation intact to BUE.  BMET    Component  Value Date/Time   NA 138 04/28/2018 1001   K 4.5 04/28/2018 1001   CL 102 04/28/2018 1001   CO2 31 04/28/2018 1001   GLUCOSE 100 (H) 04/28/2018 1001   BUN 14 04/28/2018 1001   CREATININE 1.08 04/28/2018 1001   CALCIUM 10.0 04/28/2018 1001    Lipid Panel     Component Value Date/Time   CHOL 123 04/28/2018 1001   TRIG 248.0 (H) 04/28/2018 1001   HDL 25.40 (L) 04/28/2018 1001   CHOLHDL 5 04/28/2018 1001   VLDL 49.6 (H) 04/28/2018 1001   LDLCALC 62 06/10/2017 1324    CBC No results found for: WBC, RBC, HGB, HCT, PLT, MCV, MCH, MCHC, RDW, LYMPHSABS, MONOABS, EOSABS, BASOSABS  Hgb A1C No results found for: HGBA1C         Assessment & Plan:   Neck Pain:  Xray cervical spine today Encouraged continue Ibuprofen Advised heat, stretching and massage eRx for Flexeril 5 mg QHS prn- sedation caution given Work note provided  Will follow up after xray, return precautions discussed Steve Reaper, NP

## 2018-05-15 NOTE — Patient Instructions (Signed)
Neck Exercises Neck exercises can be important for many reasons:  They can help you to improve and maintain flexibility in your neck. This can be especially important as you age.  They can help to make your neck stronger. This can make movement easier.  They can reduce or prevent neck pain.  They may help your upper back.  Ask your health care provider which neck exercises would be best for you. Exercises Neck Press Repeat this exercise 10 times. Do it first thing in the morning and right before bed or as told by your health care provider. 1. Lie on your back on a firm bed or on the floor with a pillow under your head. 2. Use your neck muscles to push your head down on the pillow and straighten your spine. 3. Hold the position as well as you can. Keep your head facing up and your chin tucked. 4. Slowly count to 5 while holding this position. 5. Relax for a few seconds. Then repeat.  Isometric Strengthening Do a full set of these exercises 2 times a day or as told by your health care provider. 1. Sit in a supportive chair and place your hand on your forehead. 2. Push forward with your head and neck while pushing back with your hand. Hold for 10 seconds. 3. Relax. Then repeat the exercise 3 times. 4. Next, do thesequence again, this time putting your hand against the back of your head. Use your head and neck to push backward against the hand pressure. 5. Finally, do the same exercise on either side of your head, pushing sideways against the pressure of your hand.  Prone Head Lifts Repeat this exercise 5 times. Do this 2 times a day or as told by your health care provider. 1. Lie face-down, resting on your elbows so that your chest and upper back are raised. 2. Start with your head facing downward, near your chest. Position your chin either on or near your chest. 3. Slowly lift your head upward. Lift until you are looking straight ahead. Then continue lifting your head as far back as  you can stretch. 4. Hold your head up for 5 seconds. Then slowly lower it to your starting position.  Supine Head Lifts Repeat this exercise 8-10 times. Do this 2 times a day or as told by your health care provider. 1. Lie on your back, bending your knees to point to the ceiling and keeping your feet flat on the floor. 2. Lift your head slowly off the floor, raising your chin toward your chest. 3. Hold for 5 seconds. 4. Relax and repeat.  Scapular Retraction Repeat this exercise 5 times. Do this 2 times a day or as told by your health care provider. 1. Stand with your arms at your sides. Look straight ahead. 2. Slowly pull both shoulders backward and downward until you feel a stretch between your shoulder blades in your upper back. 3. Hold for 10-30 seconds. 4. Relax and repeat.  Contact a health care provider if:  Your neck pain or discomfort gets much worse when you do an exercise.  Your neck pain or discomfort does not improve within 2 hours after you exercise. If you have any of these problems, stop exercising right away. Do not do the exercises again unless your health care provider says that you can. Get help right away if:  You develop sudden, severe neck pain. If this happens, stop exercising right away. Do not do the exercises again unless your   health care provider says that you can. Exercises Neck Stretch  Repeat this exercise 3-5 times. 1. Do this exercise while standing or while sitting in a chair. 2. Place your feet flat on the floor, shoulder-width apart. 3. Slowly turn your head to the right. Turn it all the way to the right so you can look over your right shoulder. Do not tilt or tip your head. 4. Hold this position for 10-30 seconds. 5. Slowly turn your head to the left, to look over your left shoulder. 6. Hold this position for 10-30 seconds.  Neck Retraction Repeat this exercise 8-10 times. Do this 3-4 times a day or as told by your health care  provider. 1. Do this exercise while standing or while sitting in a sturdy chair. 2. Look straight ahead. Do not bend your neck. 3. Use your fingers to push your chin backward. Do not bend your neck for this movement. Continue to face straight ahead. If you are doing the exercise properly, you will feel a slight sensation in your throat and a stretch at the back of your neck. 4. Hold the stretch for 1-2 seconds. Relax and repeat.  This information is not intended to replace advice given to you by your health care provider. Make sure you discuss any questions you have with your health care provider. Document Released: 08/15/2015 Document Revised: 02/09/2016 Document Reviewed: 03/14/2015 Elsevier Interactive Patient Education  2018 [employer]  

## 2018-05-16 ENCOUNTER — Telehealth: Payer: Self-pay | Admitting: Primary Care

## 2018-05-16 NOTE — Telephone Encounter (Signed)
Pt notified of CS xray results; please see result notes under imaging tab.

## 2018-05-16 NOTE — Telephone Encounter (Signed)
Copied from CRM [id number]. Topic: General - Other >> May 16, 2018  4:21 PM Gerrianne ScalePayne, Ayleen Mckinstry L wrote: Reason for CRM: pt calling about xray results

## 2018-05-16 NOTE — Telephone Encounter (Signed)
Per DPR, left detail message of [name]'s comments for patient. 

## 2018-08-11 ENCOUNTER — Encounter: Payer: Self-pay | Admitting: Primary Care

## 2018-08-11 ENCOUNTER — Ambulatory Visit: Payer: BLUE CROSS/BLUE SHIELD | Admitting: Primary Care

## 2018-08-11 VITALS — BP 130/86 | HR 102 | Temp 97.8°F | Ht 69.75 in | Wt 187.5 lb

## 2018-08-11 DIAGNOSIS — M542 Cervicalgia: Secondary | ICD-10-CM | POA: Diagnosis not present

## 2018-08-11 DIAGNOSIS — G8929 Other chronic pain: Secondary | ICD-10-CM | POA: Diagnosis not present

## 2018-08-11 MED ORDER — DICLOFENAC SODIUM 75 MG PO TBEC: 75.0000 mg | DELAYED_RELEASE_TABLET | Freq: Two times a day (BID) | ORAL | 0 refills | Status: AC | PRN

## 2018-08-11 NOTE — Progress Notes (Signed)
Subjective:    Patient ID: Steve Lopez, male    DOB: 1985-11-08, 32 y.o.   MRN: [id number]  HPI  Steve Lopez is a 32 year old male with a history of chronic back pain, chronic arthralgias (knees, left shoulder and elbow) who presents today with a chief complaint of neck pain.  He was last evaluated on 05/15/18 with complaints of a two week history of neck pain when playing with his children. He felt a sudden "electric shock" and then noticed achy pain with certain movements and decrease in ROM. He denied radiculopathy to his upper extremities. During his visit he underwent plain films of the cervical spine which showed minimal degenerative changes to the C5-6 region, otherwise unremarkable. He was treated with Flexeril, heat, massage, Ibuprofen.   Since his last visit he's not noticed improvement. He continues to notice twitching to his right neck, decrease in ROM with eating and driving, left sided neck pain. He does have pain predominantly to the left neck with radiation to the left shoulder and upper back. He denies numbness/tingling, injury/trauma.   He is applying heat/ice, stretching, taking Tylenol without improvement. He found no improvement with Flexeril.   Review of Systems  Musculoskeletal: Positive for neck pain.  Skin: Negative for color change.  Neurological: Negative for weakness and numbness.       Past Medical History:  Diagnosis Date  . Arthritis   . Depression   . Insomnia   . Tobacco abuse      Social History   Socioeconomic History  . Marital status: Married    Spouse name: Not on file  . Number of children: Not on file  . Years of education: Not on file  . Highest education level: Not on file  Occupational History  . Not on file  Social Needs  . Financial resource strain: Not on file  . Food insecurity:    Worry: Not on file    Inability: Not on file  . Transportation needs:    Medical: Not on file    Non-medical: Not on file  Tobacco Use  .  Smoking status: Former Smoker    Types: Cigarettes  . Smokeless tobacco: Never Used  Substance and Sexual Activity  . Alcohol use: No  . Drug use: No  . Sexual activity: Not on file  Lifestyle  . Physical activity:    Days per week: Not on file    Minutes per session: Not on file  . Stress: Not on file  Relationships  . Social connections:    Talks on phone: Not on file    Gets together: Not on file    Attends religious service: Not on file    Active member of club or organization: Not on file    Attends meetings of clubs or organizations: Not on file    Relationship status: Not on file  . Intimate partner violence:    Fear of current or ex partner: Not on file    Emotionally abused: Not on file    Physically abused: Not on file    Forced sexual activity: Not on file  Other Topics Concern  . Not on file  Social History Narrative   Soon to be married.   Three children.   Works as a Armed forces training and education officer.    Enjoys spending time outdoors, working on cars.    No past surgical history on file.  No family history on file.  No Known Allergies  Current Outpatient Medications on  File Prior to Visit  Medication Sig Dispense Refill  . Cholecalciferol (VITAMIN D PO) Take 5,000 Units by mouth daily.    . cyclobenzaprine (FLEXERIL) 5 MG tablet Take 1 tablet (5 mg total) by mouth at bedtime. 15 tablet 1  . imipramine (TOFRANIL) 10 MG tablet Take 50 mg by mouth at bedtime.     No current facility-administered medications on file prior to visit.     BP 130/86   Pulse (!) 102   Temp 97.8 F (36.6 C) (Oral)   Ht 5' 9.75" (1.772 m)   Wt 187 lb 8 oz (85 kg)   SpO2 97%   BMI 27.10 kg/m    Objective:   Physical Exam  Constitutional: He appears well-nourished.  Neck: Neck supple. No spinous process tenderness and no muscular tenderness present. Decreased range of motion present.    Decrease in ROM with extension, flexion, right and left rotation.   Cardiovascular: Normal  rate.  Respiratory: Effort normal.  Musculoskeletal:       Back:  Skin: Skin is warm and dry.           Assessment & Plan:

## 2018-08-11 NOTE — Assessment & Plan Note (Signed)
Present for three months without initial trauma/injury. Exam and HPI today consistent for MSK involvement. Plain films from last visit reviewed.   Rx for diclofenac provided to use PRN. Discussed to avoid other NSAID's, okay to use Tylenol. Continue ice/heat and massage.   Referral placed to physical therapy for further evaluation.

## 2018-08-11 NOTE — Patient Instructions (Signed)
Continue ice/heat and massage.   You may take the diclofenac twice daily as needed for pain. Do not take Ibuprofen, Advil, Motrin, Aleve, naproxen with this medication.   You can take Tylenol if needed.  You will be contacted regarding your referral to physical therapy.  Please let us know if you have not been contacted within one week.   It was a pleasure to see you today!

## 2018-09-02 ENCOUNTER — Other Ambulatory Visit: Payer: Self-pay | Admitting: Primary Care

## 2018-09-02 DIAGNOSIS — M542 Cervicalgia: Principal | ICD-10-CM

## 2018-09-02 DIAGNOSIS — G8929 Other chronic pain: Secondary | ICD-10-CM

## 2018-09-02 NOTE — Telephone Encounter (Signed)
Last prescribed and seen on 08/11/2018 

## 2018-09-02 NOTE — Telephone Encounter (Signed)
How often is he taking the diclofenac medication?  He should not be exceeding 2 tablets in 24 hours. Does he actually need a refill? Did he get connected with physical therapy?

## 2018-09-03 NOTE — Telephone Encounter (Signed)
Message left for patient to return my call.  

## 2018-09-08 NOTE — Telephone Encounter (Signed)
Tried to call patient but could not leave message since voicemail box is full. 

## 2019-10-23 ENCOUNTER — Telehealth: Payer: Self-pay

## 2019-10-23 DIAGNOSIS — K219 Gastro-esophageal reflux disease without esophagitis: Secondary | ICD-10-CM

## 2019-10-23 MED ORDER — OMEPRAZOLE 40 MG PO CPDR: 40.0000 mg | DELAYED_RELEASE_CAPSULE | Freq: Every day | ORAL | 0 refills | Status: AC

## 2019-10-23 NOTE — Telephone Encounter (Signed)
This wasn't on his medication list. I'll provide him with a 30 day supply until [facility].

## 2019-10-23 NOTE — Telephone Encounter (Signed)
Patient contacted the office and states that he gets his medications filled through the [place]. Patient states that he has a terrible time getting in contact with them, and getting an appointment with them is very hard also. Patient states he is really needing a refill on his Omeprazole 40mg  tablets for acid reflux. , is this something you can refill for him?

## 2019-10-23 NOTE — Addendum Note (Signed)
Addended by: Doreene Nest on: 10/23/2019 04:21 PM   Modules accepted: Orders

## 2019-10-26 NOTE — Telephone Encounter (Signed)
Spoken and notified patient of [name]'s comments. Patient verbalized understanding.  

## 2019-11-20 ENCOUNTER — Other Ambulatory Visit: Payer: Self-pay | Admitting: Primary Care

## 2019-11-20 DIAGNOSIS — K219 Gastro-esophageal reflux disease without esophagitis: Secondary | ICD-10-CM

## 2019-12-03 ENCOUNTER — Other Ambulatory Visit: Payer: Self-pay

## 2019-12-03 ENCOUNTER — Encounter: Payer: Self-pay | Admitting: Primary Care

## 2019-12-03 ENCOUNTER — Ambulatory Visit (INDEPENDENT_AMBULATORY_CARE_PROVIDER_SITE_OTHER): Payer: BC Managed Care – PPO | Admitting: Primary Care

## 2019-12-03 VITALS — BP 124/84 | HR 82 | Temp 96.8°F | Ht 69.75 in | Wt 198.8 lb

## 2019-12-03 DIAGNOSIS — R748 Abnormal levels of other serum enzymes: Secondary | ICD-10-CM | POA: Diagnosis not present

## 2019-12-03 DIAGNOSIS — J309 Allergic rhinitis, unspecified: Secondary | ICD-10-CM

## 2019-12-03 MED ORDER — CETIRIZINE HCL 10 MG PO TABS: 10.0000 mg | ORAL_TABLET | Freq: Every day | ORAL | 0 refills | Status: AC

## 2019-12-03 MED ORDER — FLUTICASONE PROPIONATE 50 MCG/ACT NA SUSP: 1.0000 | Freq: Two times a day (BID) | NASAL | 0 refills | Status: DC

## 2019-12-03 NOTE — Patient Instructions (Signed)
Nasal Congestion/Ear Pressure/Sinus Pressure: Try using Flonase (fluticasone) nasal spray. Instill 1 spray in each nostril twice daily.   Start cetirizine (Zyrtec) once nightly for drainage.  Please provide me with a copy of those labs.  It was a pleasure to see you today!

## 2019-12-03 NOTE — Assessment & Plan Note (Signed)
Endorses recently discovered on labs. He will bring a copy of the labs so I can view. Consider liver ultrasound.

## 2019-12-03 NOTE — Assessment & Plan Note (Signed)
Suspect allergy involvement in regards to symptoms of the ears and post nasal drip.   Exam today without ear infection. Rx for Flonase and Zyrtec sent to pharmacy. He will update.

## 2019-12-03 NOTE — Progress Notes (Signed)
Subjective:    Patient ID: Steve Lopez, male    DOB: [birth year], 34 y.o.   MRN: [id number]  HPI  This visit occurred during the SARS-CoV-2 public health emergency.  Safety protocols were in place, including screening questions prior to the visit, additional usage of staff PPE, and extensive cleaning of exam room while observing appropriate contact time as indicated for disinfecting solutions.   Steve Lopez is a 34 year old male with a history of chronic back pain, anxiety and depression, tobacco abuse, chronic neck pain who presents today with a chief complaint of ear fullness.  Located to the left ear for the last 2-3 months, intermittent sharp/stabbing pain. He does have constant post nasal drip. He's used anything OTC for symptoms. He denies fevers, cough.   He was evaluated by a nurse at his occupation who said that "I have fluid in my ear". He was not advised on treatment.   BP Readings from Last 3 Encounters:  12/03/19 124/84  08/11/18 130/86  05/15/18 122/82   He was also recently notified that he has elevated liver enzymes from labs drawn by the [place] a few weeks ago. He's not sure of these specific levels, has never been notified of this in the past. He does not drink alcohol, he endorses a healthy diet and gets regular exercise. He's been screened for hepatitis through the [place].   Review of Systems  Constitutional: Negative for fever.  HENT: Positive for congestion, ear pain and postnasal drip.        Left ear fullness  Respiratory: Negative for cough.   Gastrointestinal: Negative for abdominal pain and nausea.  Neurological: Negative for dizziness.       Past Medical History:  Diagnosis Date  . Arthritis   . Depression   . Insomnia   . Tobacco abuse      Social History   Socioeconomic History  . Marital status: Married    Spouse name: Not on file  . Number of children: Not on file  . Years of education: Not on file  . Highest education level: Not on file    Occupational History  . Not on file  Tobacco Use  . Smoking status: Former Smoker    Types: Cigarettes  . Smokeless tobacco: Never Used  Substance and Sexual Activity  . Alcohol use: No  . Drug use: No  . Sexual activity: Not on file  Other Topics Concern  . Not on file  Social History Narrative   Soon to be married.   Three children.   Works as a Air traffic controller.    Enjoys spending time outdoors, working on cars.   Social Determinants of Health   Financial Resource Strain:   . Difficulty of Paying Living Expenses:   Food Insecurity:   . Worried About Charity fundraiser in the Last Year:   . Arboriculturist in the Last Year:   Transportation Needs:   . Film/video editor (Medical):   Marland Kitchen Lack of Transportation (Non-Medical):   Physical Activity:   . Days of Exercise per Week:   . Minutes of Exercise per Session:   Stress:   . Feeling of Stress :   Social Connections:   . Frequency of Communication with Friends and Family:   . Frequency of Social Gatherings with Friends and Family:   . Attends Religious Services:   . Active Member of Clubs or Organizations:   . Attends Archivist Meetings:   .  Marital Status:   Intimate Partner Violence:   . Fear of Current or Ex-Partner:   . Emotionally Abused:   Marland Kitchen Physically Abused:   . Sexually Abused:     No past surgical history on file.  No family history on file.  No Known Allergies  Current Outpatient Medications on File Prior to Visit  Medication Sig Dispense Refill  . Cholecalciferol (VITAMIN D PO) Take 5,000 Units by mouth daily.    . diclofenac (VOLTAREN) 75 MG EC tablet Take 1 tablet (75 mg total) by mouth 2 (two) times daily as needed for moderate pain. 60 tablet 0  . imipramine (TOFRANIL) 10 MG tablet Take 50 mg by mouth at bedtime.    Marland Kitchen omeprazole (PRILOSEC) 40 MG capsule Take 1 capsule (40 mg total) by mouth daily. For heartburn. 30 capsule 0   No current facility-administered  medications on file prior to visit.    BP 124/84   Pulse 82   Temp (!) 96.8 F (36 C) (Temporal)   Ht 5' 9.75" (1.772 m)   Wt 198 lb 12 oz (90.2 kg)   SpO2 98%   BMI 28.72 kg/m    Objective:   Physical Exam  Constitutional: He appears well-nourished.  HENT:  Right TM with mild bulging without erythema or infection. Left TM retracted without erythema or infection.   Cardiovascular: Normal rate and regular rhythm.  Respiratory: Effort normal and breath sounds normal.  Skin: Skin is warm and dry.           Assessment & Plan:

## 2020-04-04 DIAGNOSIS — Z20822 Contact with and (suspected) exposure to covid-19: Secondary | ICD-10-CM | POA: Diagnosis not present

## 2020-04-04 DIAGNOSIS — J069 Acute upper respiratory infection, unspecified: Secondary | ICD-10-CM | POA: Diagnosis not present

## 2021-01-06 DIAGNOSIS — R937 Abnormal findings on diagnostic imaging of other parts of musculoskeletal system: Secondary | ICD-10-CM | POA: Diagnosis not present

## 2021-01-06 DIAGNOSIS — Z0189 Encounter for other specified special examinations: Secondary | ICD-10-CM | POA: Diagnosis not present

## 2021-01-09 DIAGNOSIS — M67442 Ganglion, left hand: Secondary | ICD-10-CM | POA: Diagnosis not present

## 2021-01-12 DIAGNOSIS — M722 Plantar fascial fibromatosis: Secondary | ICD-10-CM | POA: Diagnosis not present

## 2021-01-19 DIAGNOSIS — M67442 Ganglion, left hand: Secondary | ICD-10-CM | POA: Diagnosis not present

## 2021-01-30 DIAGNOSIS — M67442 Ganglion, left hand: Secondary | ICD-10-CM | POA: Diagnosis not present

## 2021-06-05 DIAGNOSIS — G43909 Migraine, unspecified, not intractable, without status migrainosus: Secondary | ICD-10-CM | POA: Diagnosis not present

## 2021-06-05 DIAGNOSIS — H52203 Unspecified astigmatism, bilateral: Secondary | ICD-10-CM | POA: Diagnosis not present

## 2021-06-05 DIAGNOSIS — H5213 Myopia, bilateral: Secondary | ICD-10-CM | POA: Diagnosis not present

## 2021-06-13 DIAGNOSIS — Z7984 Long term (current) use of oral hypoglycemic drugs: Secondary | ICD-10-CM | POA: Diagnosis not present

## 2021-06-13 DIAGNOSIS — E119 Type 2 diabetes mellitus without complications: Secondary | ICD-10-CM | POA: Diagnosis not present

## 2021-06-23 DIAGNOSIS — H53149 Visual discomfort, unspecified: Secondary | ICD-10-CM | POA: Diagnosis not present

## 2021-06-23 DIAGNOSIS — H52203 Unspecified astigmatism, bilateral: Secondary | ICD-10-CM | POA: Diagnosis not present

## 2021-06-23 DIAGNOSIS — G43909 Migraine, unspecified, not intractable, without status migrainosus: Secondary | ICD-10-CM | POA: Diagnosis not present

## 2021-06-23 DIAGNOSIS — H5213 Myopia, bilateral: Secondary | ICD-10-CM | POA: Diagnosis not present

## 2021-06-29 DIAGNOSIS — Z7984 Long term (current) use of oral hypoglycemic drugs: Secondary | ICD-10-CM | POA: Diagnosis not present

## 2021-06-29 DIAGNOSIS — E119 Type 2 diabetes mellitus without complications: Secondary | ICD-10-CM | POA: Diagnosis not present

## 2021-07-06 DIAGNOSIS — Z0189 Encounter for other specified special examinations: Secondary | ICD-10-CM | POA: Diagnosis not present

## 2021-07-25 DIAGNOSIS — E119 Type 2 diabetes mellitus without complications: Secondary | ICD-10-CM | POA: Diagnosis not present

## 2021-07-25 DIAGNOSIS — Z7984 Long term (current) use of oral hypoglycemic drugs: Secondary | ICD-10-CM | POA: Diagnosis not present

## 2021-09-07 DIAGNOSIS — Z7984 Long term (current) use of oral hypoglycemic drugs: Secondary | ICD-10-CM | POA: Diagnosis not present

## 2021-09-07 DIAGNOSIS — E119 Type 2 diabetes mellitus without complications: Secondary | ICD-10-CM | POA: Diagnosis not present

## 2021-09-27 DIAGNOSIS — G4733 Obstructive sleep apnea (adult) (pediatric): Secondary | ICD-10-CM | POA: Diagnosis not present

## 2021-10-15 DIAGNOSIS — R1031 Right lower quadrant pain: Secondary | ICD-10-CM | POA: Diagnosis not present

## 2021-10-20 DIAGNOSIS — N132 Hydronephrosis with renal and ureteral calculous obstruction: Secondary | ICD-10-CM | POA: Diagnosis not present

## 2021-11-03 ENCOUNTER — Encounter: Payer: Self-pay | Admitting: Emergency Medicine

## 2021-11-03 ENCOUNTER — Other Ambulatory Visit: Payer: Self-pay

## 2021-11-03 ENCOUNTER — Emergency Department: Payer: No Typology Code available for payment source

## 2021-11-03 ENCOUNTER — Emergency Department
Admission: EM | Admit: 2021-11-03 | Discharge: 2021-11-03 | Disposition: A | Discharge status: Home / Self Care | Payer: No Typology Code available for payment source | Attending: Student in an Organized Health Care Education/Training Program | Admitting: Student in an Organized Health Care Education/Training Program

## 2021-11-03 DIAGNOSIS — R195 Other fecal abnormalities: Secondary | ICD-10-CM | POA: Diagnosis not present

## 2021-11-03 DIAGNOSIS — N50811 Right testicular pain: Secondary | ICD-10-CM | POA: Diagnosis not present

## 2021-11-03 DIAGNOSIS — N200 Calculus of kidney: Secondary | ICD-10-CM | POA: Insufficient documentation

## 2021-11-03 DIAGNOSIS — R109 Unspecified abdominal pain: Secondary | ICD-10-CM | POA: Diagnosis present

## 2021-11-03 LAB — COMPREHENSIVE METABOLIC PANEL
ALT: 22 U/L (ref 0–44)
AST: 19 U/L (ref 15–41)
Albumin: 4.2 g/dL (ref 3.5–5.0)
Alkaline Phosphatase: 56 U/L (ref 38–126)
Anion gap: 7 (ref 5–15)
BUN: 26 mg/dL — ABNORMAL HIGH (ref 6–20)
CO2: 31 mmol/L (ref 22–32)
Calcium: 9.8 mg/dL (ref 8.9–10.3)
Chloride: 98 mmol/L (ref 98–111)
Creatinine, Ser: 1.9 mg/dL — ABNORMAL HIGH (ref 0.61–1.24)
GFR, Estimated: 47 mL/min — ABNORMAL LOW (ref >60)
Glucose, Bld: 93 mg/dL (ref 70–99)
Potassium: 4.1 mmol/L (ref 3.5–5.1)
Sodium: 136 mmol/L (ref 135–145)
Total Bilirubin: 0.6 mg/dL (ref 0.3–1.2)
Total Protein: 7.6 g/dL (ref 6.5–8.1)

## 2021-11-03 LAB — URINALYSIS, COMPLETE (UACMP) WITH MICROSCOPIC
Bacteria, UA: NONE SEEN
Bilirubin Urine: NEGATIVE
Glucose, UA: NEGATIVE mg/dL
Hgb urine dipstick: NEGATIVE
Ketones, ur: 20 mg/dL — AB
Leukocytes,Ua: NEGATIVE
Nitrite: NEGATIVE
Protein, ur: NEGATIVE mg/dL
Specific Gravity, Urine: 1.015 (ref 1.005–1.030)
Squamous Epithelial / HPF: NONE SEEN (ref 0–5)
pH: 6 (ref 5.0–8.0)

## 2021-11-03 LAB — CBC
HCT: 43.7 % (ref 39.0–52.0)
Hemoglobin: 15.2 g/dL (ref 13.0–17.0)
MCH: 29 pg (ref 26.0–34.0)
MCHC: 34.8 g/dL (ref 30.0–36.0)
MCV: 83.4 fL (ref 80.0–100.0)
Platelets: 222 10*3/uL (ref 150–400)
RBC: 5.24 MIL/uL (ref 4.22–5.81)
RDW: 11.9 % (ref 11.5–15.5)
WBC: 9.3 10*3/uL (ref 4.0–10.5)
nRBC: 0 % (ref 0.0–0.2)

## 2021-11-03 MED ORDER — MORPHINE SULFATE (PF) 2 MG/ML IV SOLN
INTRAVENOUS | Status: AC
  Filled 2021-11-03: qty 2

## 2021-11-03 MED ORDER — KETOROLAC TROMETHAMINE 30 MG/ML IJ SOLN
15.0000 mg | Freq: Once | INTRAMUSCULAR | Status: AC
  Administered 2021-11-03: 15 mg via INTRAVENOUS
  Filled 2021-11-03: qty 1

## 2021-11-03 MED ORDER — MORPHINE SULFATE (PF) 4 MG/ML IV SOLN
4.0000 mg | INTRAVENOUS | Status: DC | PRN
  Administered 2021-11-03: 4 mg via INTRAVENOUS

## 2021-11-03 MED ORDER — ONDANSETRON HCL 4 MG/2ML IJ SOLN
4.0000 mg | Freq: Once | INTRAMUSCULAR | Status: AC
  Administered 2021-11-03: 4 mg via INTRAVENOUS
  Filled 2021-11-03: qty 2

## 2021-11-03 MED ORDER — SODIUM CHLORIDE 0.9 % IV BOLUS
1000.0000 mL | Freq: Once | INTRAVENOUS | Status: AC
  Administered 2021-11-03: 1000 mL via INTRAVENOUS

## 2021-11-03 MED ORDER — OXYCODONE-ACETAMINOPHEN 5-325 MG PO TABS: 1.0000 | ORAL_TABLET | ORAL | 0 refills | Status: AC | PRN

## 2021-11-03 MED ORDER — OXYCODONE-ACETAMINOPHEN 5-325 MG PO TABS: 1.0000 | ORAL_TABLET | Freq: Once | ORAL | Status: DC

## 2021-11-03 NOTE — ED Provider Notes (Signed)
[facility] Provider Note    Event Date/Time   First MD Initiated Contact with Patient 11/03/21 2055     (approximate)   History   Flank Pain   HPI  Steve Lopez is a 36 y.o. male with recent new diagnosis of kidney stone at the end of January presents to the ER for persistent but acutely worsening right flank pain that started today.  States he was initially seen at the [place] and told that he had a small kidney stone that should pass on its own but that was 3 weeks ago.  Having persistent discomfort and still has not passed the stone.  Does not have follow-up with urology until March.  States he was having severe pain in his right testicle when asked why he came in today.  Denies any dysuria.     Physical Exam   Triage Vital Signs: ED Triage Vitals  Enc Vitals Group     BP 11/03/21 1842 (!) 147/102     Pulse Rate 11/03/21 1842 (!) 102     Resp 11/03/21 1842 16     Temp 11/03/21 1842 98.4 F (36.9 C)     Temp Source 11/03/21 1842 Oral     SpO2 11/03/21 1842 97 %     Weight 11/03/21 1840 198 lb 13.7 oz (90.2 kg)     Height 11/03/21 1840 5' 9.75" (1.772 m)     Head Circumference --      Peak Flow --      Pain Score 11/03/21 1840 10     Pain Loc --      Pain Edu? --      Excl. in GC? --     Most recent vital signs: Vitals:   11/03/21 2130 11/03/21 2230  BP: (!) 142/99 (!) 140/97  Pulse: 89 91  Resp: 20 20  Temp:    SpO2: 97% 96%     Constitutional: Alert  Eyes: Conjunctivae are normal.  Head: Atraumatic. Nose: No congestion/rhinnorhea. Mouth/Throat: Mucous membranes are moist.   Neck: Painless ROM.  Cardiovascular:   Good peripheral circulation. Respiratory: Normal respiratory effort.  No retractions.  Gastrointestinal: Soft and nontender.  Normal external genitalia cremasteric reflex present. Musculoskeletal:  no deformity Neurologic:  MAE spontaneously. No gross focal neurologic deficits are appreciated.  Skin:  Skin is warm, dry  and intact. No rash noted. Psychiatric: Mood and affect are normal. Speech and behavior are normal.    ED Results / Procedures / Treatments   Labs (all labs ordered are listed, but only abnormal results are displayed) Labs Reviewed  COMPREHENSIVE METABOLIC PANEL - Abnormal; Notable for the following components:      Result Value   BUN 26 (*)    Creatinine, Ser 1.90 (*)    GFR, Estimated 47 (*)    All other components within normal limits  URINALYSIS, COMPLETE (UACMP) WITH MICROSCOPIC - Abnormal; Notable for the following components:   Color, Urine YELLOW (*)    APPearance CLEAR (*)    Ketones, ur 20 (*)    All other components within normal limits  CBC     EKG     RADIOLOGY Please see ED Course for my review and interpretation.  I personally reviewed all radiographic images ordered to evaluate for the above acute complaints and reviewed radiology reports and findings.  These findings were personally discussed with the patient.  Please see medical record for radiology report.    PROCEDURES:  Critical Care performed: No  Procedures   MEDICATIONS ORDERED IN ED: Medications  morphine (PF) 4 MG/ML injection 4 mg (4 mg Intravenous Given 11/03/21 1853)  morphine (PF) 2 MG/ML injection (  Not Given 11/03/21 1852)  oxyCODONE-acetaminophen (PERCOCET/ROXICET) 5-325 MG per tablet 1 tablet (0 tablets Oral Hold 11/03/21 2244)  ondansetron (ZOFRAN) injection 4 mg (4 mg Intravenous Given 11/03/21 1853)  sodium chloride 0.9 % bolus 1,000 mL (1,000 mLs Intravenous New Bag/Given 11/03/21 2134)  ketorolac (TORADOL) 30 MG/ML injection 15 mg (15 mg Intravenous Given 11/03/21 2132)     IMPRESSION / MDM / ASSESSMENT AND PLAN / ED COURSE  I reviewed the triage vital signs and the nursing notes.                              Differential diagnosis includes, but is not limited to, stone, pyelo-, cystitis, colic, musculoskeletal strain, appendicitis, torsion, orchitis  Patient presenting  with right flank pain and groin pain consistent with ureteral stone by CT.  On my review of CT imaging does have a fair amount of stool burden and right-sided stone.  Per radiology report 4 mm stone with moderate hydro on the right.  Blood work otherwise reassuring will give IV hydration as well as IV Toradol IV morphine deceiving get his pain under control we will check urine.   Clinical Course as of 11/03/21 2333  Fri Nov 03, 2021  2327 Urine without sign of infection.  Does appear stable and appropriate for outpatient follow-up.  Reviewed [place] PMP patient does have prescription for Norco but he states pain is only moderately controlled with diet therefore we will give him a few additional Percocet until he is able to get in with urology clinic.  We discussed other conservative measures and signs and symptoms for which she should return to the ER. [PR]    Clinical Course User Index [PR] Willy Eddy, MD     FINAL CLINICAL IMPRESSION(S) / ED DIAGNOSES   Final diagnoses:  Kidney stone     Rx / DC Orders   ED Discharge Orders          Ordered    oxyCODONE-acetaminophen (PERCOCET) 5-325 MG tablet  Every 4 hours PRN        11/03/21 2332             Note:  This document was prepared using Dragon voice recognition software and may include unintentional dictation errors.    Willy Eddy, MD 11/03/21 [phone]

## 2021-11-03 NOTE — ED Notes (Signed)
Pt given meal tray and water 

## 2021-11-03 NOTE — ED Notes (Signed)
Patient transported to CT 

## 2021-11-03 NOTE — ED Triage Notes (Signed)
Has known kidney stone to right side.  Today, C/O right flank pain radiating to right testicle.

## 2021-11-07 NOTE — H&P (View-Only) (Signed)
11/08/21 4:29 PM   Steve Lopez [birth year] [id number]  Referring provider:  Pleas Koch, NP [place],  [place] [address] Chief Complaint  Patient presents with   Nephrolithiasis     HPI: Steve Lopez is a 36 y.o.male who presents today for further evaluation of kidney stones.   He was seen in the ED on 11/03/2021 for persistent but acutely worsening right flank pain. Urinalysis showed 20 ketones but was otherwise unremarkable. CT renal stone study visualized A 1.5 cm diameter cystic appearing area is noted within the posterolateral aspect of the mid left kidney. A 3 mm nonobstructing renal calculus is seen within the anterior aspect of the mid left kidney. A 2 mm nonobstructing renal calculus is seen within the lower pole of the right kidney. A 4 mm obstructing renal calculus is seen within the proximal right ureter with moderate severity right-sided hydronephrosis and hydroureter. Bladder is unremarkable. KUB visualized right proximal ureteral stone on CT is not seen on the current exam. Additionally the punctate intrarenal calculi are not seen. Technically limited radiographic evaluation due to stool and bowel gas.  Cr 1.9 from from 1.08.   WBC 9.3.    He reports that he has no history of kidney stones.   He reports that he has felt pain in his testicle it was initially in his RLQ and he originally thought he has appendicitis.   Ultimately in the ER, his pain was able to be controlled he was discharged home.  He initially felt better for few days and then had acute episode again over the weekend.  He is has been looking out for stone has not seen a pass.  He has had some dull aching in his right lower quadrant in his testicle.  No fevers or chills.  No further nausea or vomiting.  No dysuria or gross hematuria.   PMH: Past Medical History:  Diagnosis Date   Arthritis    Depression    Insomnia    Tobacco abuse     Surgical History: History reviewed.  No pertinent surgical history.  Home Medications:  Allergies as of 11/08/2021   No Known Allergies      Medication List        Accurate as of November 08, 2021  4:29 PM. If you have any questions, ask your nurse or doctor.          STOP taking these medications    metFORMIN 500 MG tablet Commonly known as: GLUCOPHAGE Stopped by: Hollice Espy, MD   ondansetron 4 MG disintegrating tablet Commonly known as: ZOFRAN-ODT Stopped by: Hollice Espy, MD       TAKE these medications    cetirizine 10 MG tablet Commonly known as: ZYRTEC Take 1 tablet (10 mg total) by mouth at bedtime. For allergies.   diclofenac 75 MG EC tablet Commonly known as: VOLTAREN Take 1 tablet (75 mg total) by mouth 2 (two) times daily as needed for moderate pain.   fluticasone 50 MCG/ACT nasal spray Commonly known as: FLONASE Place 1 spray into both nostrils 2 (two) times daily.   imipramine 50 MG tablet Commonly known as: TOFRANIL TAKE ONE TABLET BY MOUTH AT BEDTIME FOR ANXIETY What changed: Another medication with the same name was removed. Continue taking this medication, and follow the directions you see here. Changed by: Hollice Espy, MD   ketorolac 10 MG tablet Commonly known as: TORADOL Take 10 mg by mouth 3 (three) times daily.   omeprazole 40  MG capsule Commonly known as: PRILOSEC Take 1 capsule (40 mg total) by mouth daily. For heartburn. What changed: Another medication with the same name was removed. Continue taking this medication, and follow the directions you see here. Changed by: Hollice Espy, MD   ondansetron 8 MG tablet Commonly known as: ZOFRAN Take by mouth.   oxyCODONE-acetaminophen 5-325 MG tablet Commonly known as: Percocet Take 1 tablet by mouth every 4 (four) hours as needed for severe pain.   tamsulosin 0.4 MG Caps capsule Commonly known as: FLOMAX Take 0.4 mg by mouth daily.   VITAMIN D PO Take 5,000 Units by mouth daily.        Allergies:  No Known Allergies  Family History: History reviewed. No pertinent family history.  Social History:  reports that he has quit smoking. His smoking use included cigarettes. He has never used smokeless tobacco. He reports that he does not drink alcohol and does not use drugs.   Physical Exam: BP (!) 144/89    Pulse (!) 109    Ht 5\' 10"  (1.778 m)    Wt 190 lb (86.2 kg)    BMI 27.26 kg/m   Constitutional:  Alert and oriented, No acute distress. HEENT: [place] AT, moist mucus membranes.  Trachea midline, no masses. Cardiovascular: No clubbing, cyanosis, or edema. Respiratory: Normal respiratory effort, no increased work of breathing. Skin: No rashes, bruises or suspicious lesions. Neurologic: Grossly intact, no focal deficits, moving all 4 extremities. Psychiatric: Normal mood and affect.  Laboratory Data: Lab Results  Component Value Date   CREATININE 1.90 (H) 11/03/2021   Pertinent Imaging: CLINICAL DATA:  Right flank pain.   EXAM: CT ABDOMEN AND PELVIS WITHOUT CONTRAST   TECHNIQUE: Multidetector CT imaging of the abdomen and pelvis was performed following the standard protocol without IV contrast.   RADIATION DOSE REDUCTION: This exam was performed according to the departmental dose-optimization program which includes automated exposure control, adjustment of the mA and/or kV according to patient size and/or use of iterative reconstruction technique.   COMPARISON:  None.   FINDINGS: Lower chest: Mild linear scarring and/or atelectasis is seen within the bilateral lung bases.   Hepatobiliary: No focal liver abnormality is seen. The gallbladder is poorly visualized and may be contracted. There is no evidence of biliary dilatation.   Pancreas: Unremarkable. No pancreatic ductal dilatation or surrounding inflammatory changes.   Spleen: Normal in size without focal abnormality.   Adrenals/Urinary Tract: Adrenal glands are unremarkable. Kidneys are normal in size. A 1.5 cm  diameter cystic appearing area is noted within the posterolateral aspect of the mid left kidney. A 3 mm nonobstructing renal calculus is seen within the anterior aspect of the mid left kidney. A 2 mm nonobstructing renal calculus is seen within the lower pole of the right kidney. A 4 mm obstructing renal calculus is seen within the proximal right ureter with moderate severity right-sided hydronephrosis and hydroureter. Bladder is unremarkable.   Stomach/Bowel: Stomach is within normal limits. Appendix appears normal. No evidence of bowel wall thickening, distention, or inflammatory changes.   Vascular/Lymphatic: No significant vascular findings are present. No enlarged abdominal or pelvic lymph nodes.   Reproductive: Prostate is unremarkable.   Other: No abdominal wall hernia or abnormality. No abdominopelvic ascites.   Musculoskeletal: No acute or significant osseous findings.   IMPRESSION: 1. 4 mm obstructing renal calculus within the proximal right ureter with moderate severity right-sided hydronephrosis and hydroureter. 2. Bilateral nonobstructing renal calculi.     Electronically Signed  By: Virgina Norfolk M.D.   On: 11/03/2021 19:06  CLINICAL DATA:  Right-sided abdominal pain. Evaluate right renal stone.   EXAM: ABDOMEN - 1 VIEW   COMPARISON:  Abdominal CT 2-1/2 hours ago   FINDINGS: Right proximal ureteral stone on CT is not seen on the current exam, large amount of stool and bowel gas obscure region of stone on CT. No definite passage into the distal ureter. Punctate bilateral intrarenal calculi on CT are also not seen. There is a large volume of stool in the colon, particularly the right colon.   IMPRESSION: 1. Right proximal ureteral stone on CT is not seen on the current exam. Additionally the punctate intrarenal calculi are not seen. Technically limited radiographic evaluation due to stool and bowel gas. 2. Large volume of stool in the colon.      Electronically Signed   By: Keith Rake M.D.   On: 11/03/2021 21:51  CT scan and KUB personally reviewed, agree with radiologic interpretation except for stone measures 4 mm in the axial plane but on coronal, it measures 7 mm.  Assessment & Plan:  Right ureteral stone We discussed various treatment options for urolithiasis including observation with or without medical expulsive therapy, shockwave lithotripsy (SWL), ureteroscopy and laser lithotripsy with stent placement, and percutaneous nephrolithotomy.   We discussed that management is based on stone size, location, density, patient co-morbidities, and patient preference.    Stones <48mm in size have a >80% spontaneous passage rate. Data surrounding the use of tamsulosin for medical expulsive therapy is controversial, but meta analyses suggests it is most efficacious for distal stones between 5-44mm in size. Possible side effects include dizziness/lightheadedness, and retrograde ejaculation.   SWL has a lower stone free rate in a single procedure, but also a lower complication rate compared to ureteroscopy and avoids a stent and associated stent related symptoms. Possible complications include renal hematoma, steinstrasse, and need for additional treatment. We discussed the role of his increased skin to stone distance can lead to decreased efficacy with shockwave lithotripsy.   Ureteroscopy with laser lithotripsy and stent placement has a higher stone free rate than SWL in a single procedure, however increased complication rate including possible infection, ureteral injury, bleeding, and stent related morbidity. Common stent related symptoms include dysuria, urgency/frequency, and flank pain.   After an extensive discussion of the risks and benefits of the above treatment options, the patient would like to proceed with ESWL   - KUB tomorrow morning   Return for ESWL  I,Kailey Littlejohn,acting as a scribe for Hollice Espy, MD.,have  documented all relevant documentation on the behalf of Hollice Espy, MD,as directed by  Hollice Espy, MD while in the presence of Hollice Espy, MD.  I have reviewed the above documentation for accuracy and completeness, and I agree with the above.   Hollice Espy, MD  [facility] [address], [place], [place] [address] [phone]

## 2021-11-07 NOTE — Progress Notes (Signed)
11/08/21 4:29 PM   Steve Lopez February 18, 1986 [id number]  Referring provider:  Pleas Koch, NP [place],  [place] [address] Chief Complaint  Patient presents with   Nephrolithiasis     HPI: Steve Lopez is a 36 y.o.male who presents today for further evaluation of kidney stones.   He was seen in the ED on 11/03/2021 for persistent but acutely worsening right flank pain. Urinalysis showed 20 ketones but was otherwise unremarkable. CT renal stone study visualized A 1.5 cm diameter cystic appearing area is noted within the posterolateral aspect of the mid left kidney. A 3 mm nonobstructing renal calculus is seen within the anterior aspect of the mid left kidney. A 2 mm nonobstructing renal calculus is seen within the lower pole of the right kidney. A 4 mm obstructing renal calculus is seen within the proximal right ureter with moderate severity right-sided hydronephrosis and hydroureter. Bladder is unremarkable. KUB visualized right proximal ureteral stone on CT is not seen on the current exam. Additionally the punctate intrarenal calculi are not seen. Technically limited radiographic evaluation due to stool and bowel gas.  Cr 1.9 from from 1.08.   WBC 9.3.    He reports that he has no history of kidney stones.   He reports that he has felt pain in his testicle it was initially in his RLQ and he originally thought he has appendicitis.   Ultimately in the ER, his pain was able to be controlled he was discharged home.  He initially felt better for few days and then had acute episode again over the weekend.  He is has been looking out for stone has not seen a pass.  He has had some dull aching in his right lower quadrant in his testicle.  No fevers or chills.  No further nausea or vomiting.  No dysuria or gross hematuria.   PMH: Past Medical History:  Diagnosis Date   Arthritis    Depression    Insomnia    Tobacco abuse     Surgical History: History reviewed.  No pertinent surgical history.  Home Medications:  Allergies as of 11/08/2021   No Known Allergies      Medication List        Accurate as of November 08, 2021  4:29 PM. If you have any questions, ask your nurse or doctor.          STOP taking these medications    metFORMIN 500 MG tablet Commonly known as: GLUCOPHAGE Stopped by: Hollice Espy, MD   ondansetron 4 MG disintegrating tablet Commonly known as: ZOFRAN-ODT Stopped by: Hollice Espy, MD       TAKE these medications    cetirizine 10 MG tablet Commonly known as: ZYRTEC Take 1 tablet (10 mg total) by mouth at bedtime. For allergies.   diclofenac 75 MG EC tablet Commonly known as: VOLTAREN Take 1 tablet (75 mg total) by mouth 2 (two) times daily as needed for moderate pain.   fluticasone 50 MCG/ACT nasal spray Commonly known as: FLONASE Place 1 spray into both nostrils 2 (two) times daily.   imipramine 50 MG tablet Commonly known as: TOFRANIL TAKE ONE TABLET BY MOUTH AT BEDTIME FOR ANXIETY What changed: Another medication with the same name was removed. Continue taking this medication, and follow the directions you see here. Changed by: Hollice Espy, MD   ketorolac 10 MG tablet Commonly known as: TORADOL Take 10 mg by mouth 3 (three) times daily.   omeprazole 40  MG capsule °Commonly known as: PRILOSEC °Take 1 capsule (40 mg total) by mouth daily. For heartburn. °What changed: Another medication with the same name was removed. Continue taking this medication, and follow the directions you see here. °Changed by: Kiel Cockerell, MD °  °ondansetron 8 MG tablet °Commonly known as: ZOFRAN °Take by mouth. °  °oxyCODONE-acetaminophen 5-325 MG tablet °Commonly known as: Percocet °Take 1 tablet by mouth every 4 (four) hours as needed for severe pain. °  °tamsulosin 0.4 MG Caps capsule °Commonly known as: FLOMAX °Take 0.4 mg by mouth daily. °  °VITAMIN D PO °Take 5,000 Units by mouth daily. °  ° °  ° ° °Allergies:  No Known Allergies ° °Family History: °History reviewed. No pertinent family history. ° °Social History:  reports that he has quit smoking. His smoking use included cigarettes. He has never used smokeless tobacco. He reports that he does not drink alcohol and does not use drugs. ° ° °Physical Exam: °BP (!) 144/89    Pulse (!) 109    Ht 5' 10" (1.778 m)    Wt 190 lb (86.2 kg)    BMI 27.26 kg/m²   °Constitutional:  Alert and oriented, No acute distress. °HEENT: [place] AT, moist mucus membranes.  Trachea midline, no masses. °Cardiovascular: No clubbing, cyanosis, or edema. °Respiratory: Normal respiratory effort, no increased work of breathing. °Skin: No rashes, bruises or suspicious lesions. °Neurologic: Grossly intact, no focal deficits, moving all 4 extremities. °Psychiatric: Normal mood and affect. ° °Laboratory Data: °Lab Results  °Component Value Date  ° CREATININE 1.90 (H) 11/03/2021  ° °Pertinent Imaging: °CLINICAL DATA:  Right flank pain. °  °EXAM: °CT ABDOMEN AND PELVIS WITHOUT CONTRAST °  °TECHNIQUE: °Multidetector CT imaging of the abdomen and pelvis was performed °following the standard protocol without IV contrast. °  °RADIATION DOSE REDUCTION: This exam was performed according to the °departmental dose-optimization program which includes automated °exposure control, adjustment of the mA and/or kV according to °patient size and/or use of iterative reconstruction technique. °  °COMPARISON:  None. °  °FINDINGS: °Lower chest: Mild linear scarring and/or atelectasis is seen within °the bilateral lung bases. °  °Hepatobiliary: No focal liver abnormality is seen. The gallbladder °is poorly visualized and may be contracted. There is no evidence of °biliary dilatation. °  °Pancreas: Unremarkable. No pancreatic ductal dilatation or °surrounding inflammatory changes. °  °Spleen: Normal in size without focal abnormality. °  °Adrenals/Urinary Tract: Adrenal glands are unremarkable. Kidneys are °normal in size. A 1.5 cm  diameter cystic appearing area is noted °within the posterolateral aspect of the mid left kidney. A 3 mm °nonobstructing renal calculus is seen within the anterior aspect of °the mid left kidney. A 2 mm nonobstructing renal calculus is seen °within the lower pole of the right kidney. A 4 mm obstructing renal °calculus is seen within the proximal right ureter with moderate °severity right-sided hydronephrosis and hydroureter. Bladder is °unremarkable. °  °Stomach/Bowel: Stomach is within normal limits. Appendix appears °normal. No evidence of bowel wall thickening, distention, or °inflammatory changes. °  °Vascular/Lymphatic: No significant vascular findings are present. No °enlarged abdominal or pelvic lymph nodes. °  °Reproductive: Prostate is unremarkable. °  °Other: No abdominal wall hernia or abnormality. No abdominopelvic °ascites. °  °Musculoskeletal: No acute or significant osseous findings. °  °IMPRESSION: °1. 4 mm obstructing renal calculus within the proximal right ureter °with moderate severity right-sided hydronephrosis and hydroureter. °2. Bilateral nonobstructing renal calculi. °  °  °Electronically Signed °    By: Virgina Norfolk M.D.   On: 11/03/2021 19:06  CLINICAL DATA:  Right-sided abdominal pain. Evaluate right renal stone.   EXAM: ABDOMEN - 1 VIEW   COMPARISON:  Abdominal CT 2-1/2 hours ago   FINDINGS: Right proximal ureteral stone on CT is not seen on the current exam, large amount of stool and bowel gas obscure region of stone on CT. No definite passage into the distal ureter. Punctate bilateral intrarenal calculi on CT are also not seen. There is a large volume of stool in the colon, particularly the right colon.   IMPRESSION: 1. Right proximal ureteral stone on CT is not seen on the current exam. Additionally the punctate intrarenal calculi are not seen. Technically limited radiographic evaluation due to stool and bowel gas. 2. Large volume of stool in the colon.      Electronically Signed   By: Keith Rake M.D.   On: 11/03/2021 21:51  CT scan and KUB personally reviewed, agree with radiologic interpretation except for stone measures 4 mm in the axial plane but on coronal, it measures 7 mm.  Assessment & Plan:  Right ureteral stone We discussed various treatment options for urolithiasis including observation with or without medical expulsive therapy, shockwave lithotripsy (SWL), ureteroscopy and laser lithotripsy with stent placement, and percutaneous nephrolithotomy.   We discussed that management is based on stone size, location, density, patient co-morbidities, and patient preference.    Stones <40mm in size have a >80% spontaneous passage rate. Data surrounding the use of tamsulosin for medical expulsive therapy is controversial, but meta analyses suggests it is most efficacious for distal stones between 5-47mm in size. Possible side effects include dizziness/lightheadedness, and retrograde ejaculation.   SWL has a lower stone free rate in a single procedure, but also a lower complication rate compared to ureteroscopy and avoids a stent and associated stent related symptoms. Possible complications include renal hematoma, steinstrasse, and need for additional treatment. We discussed the role of his increased skin to stone distance can lead to decreased efficacy with shockwave lithotripsy.   Ureteroscopy with laser lithotripsy and stent placement has a higher stone free rate than SWL in a single procedure, however increased complication rate including possible infection, ureteral injury, bleeding, and stent related morbidity. Common stent related symptoms include dysuria, urgency/frequency, and flank pain.   After an extensive discussion of the risks and benefits of the above treatment options, the patient would like to proceed with ESWL   - KUB tomorrow morning   Return for ESWL  I,Kailey Littlejohn,acting as a scribe for Hollice Espy, MD.,have  documented all relevant documentation on the behalf of Hollice Espy, MD,as directed by  Hollice Espy, MD while in the presence of Hollice Espy, MD.  I have reviewed the above documentation for accuracy and completeness, and I agree with the above.   Hollice Espy, MD  [facility] [address] [place], [place] [address] [phone]

## 2021-11-08 ENCOUNTER — Ambulatory Visit (INDEPENDENT_AMBULATORY_CARE_PROVIDER_SITE_OTHER): Payer: BC Managed Care – PPO | Admitting: Urology

## 2021-11-08 ENCOUNTER — Other Ambulatory Visit: Payer: Self-pay

## 2021-11-08 ENCOUNTER — Encounter: Payer: Self-pay | Admitting: Urology

## 2021-11-08 VITALS — BP 144/89 | HR 109 | Ht 70.0 in | Wt 190.0 lb

## 2021-11-08 DIAGNOSIS — N200 Calculus of kidney: Secondary | ICD-10-CM

## 2021-11-09 ENCOUNTER — Ambulatory Visit
Admission: RE | Admit: 2021-11-09 | Discharge: 2021-11-09 | Disposition: A | Discharge status: Home / Self Care | Payer: BC Managed Care – PPO | Source: Ambulatory Visit | Attending: Urology | Admitting: Urology

## 2021-11-09 ENCOUNTER — Ambulatory Visit: Payer: BC Managed Care – PPO

## 2021-11-09 ENCOUNTER — Encounter
Admission: RE | Disposition: A | Discharge status: Home / Self Care | Payer: Self-pay | Source: Ambulatory Visit | Attending: Urology

## 2021-11-09 ENCOUNTER — Other Ambulatory Visit: Payer: Self-pay

## 2021-11-09 ENCOUNTER — Encounter: Payer: Self-pay | Admitting: Urology

## 2021-11-09 DIAGNOSIS — N200 Calculus of kidney: Secondary | ICD-10-CM

## 2021-11-09 DIAGNOSIS — Z87891 Personal history of nicotine dependence: Secondary | ICD-10-CM | POA: Insufficient documentation

## 2021-11-09 DIAGNOSIS — N201 Calculus of ureter: Secondary | ICD-10-CM | POA: Insufficient documentation

## 2021-11-09 HISTORY — PX: EXTRACORPOREAL SHOCK WAVE LITHOTRIPSY: SHX1557

## 2021-11-09 LAB — MICROSCOPIC EXAMINATION: Bacteria, UA: NONE SEEN

## 2021-11-09 LAB — URINALYSIS, COMPLETE
Bilirubin, UA: NEGATIVE
Glucose, UA: NEGATIVE
Ketones, UA: NEGATIVE
Leukocytes,UA: NEGATIVE
Nitrite, UA: NEGATIVE
Protein,UA: NEGATIVE
RBC, UA: NEGATIVE
Specific Gravity, UA: 1.02 (ref 1.005–1.030)
Urobilinogen, Ur: 0.2 mg/dL (ref 0.2–1.0)
pH, UA: 6.5 (ref 5.0–7.5)

## 2021-11-09 SURGERY — LITHOTRIPSY, ESWL
Anesthesia: Moderate Sedation | Laterality: Right

## 2021-11-09 MED ORDER — ONDANSETRON HCL 4 MG/2ML IJ SOLN
INTRAMUSCULAR | Status: AC
  Administered 2021-11-09: 4 mg via INTRAVENOUS
  Filled 2021-11-09: qty 2

## 2021-11-09 MED ORDER — ONDANSETRON HCL 4 MG/2ML IJ SOLN: 4.0000 mg | Freq: Once | INTRAMUSCULAR | Status: AC

## 2021-11-09 MED ORDER — DIAZEPAM 5 MG PO TABS
ORAL_TABLET | ORAL | Status: AC
  Administered 2021-11-09: 10 mg via ORAL
  Filled 2021-11-09: qty 2

## 2021-11-09 MED ORDER — DIAZEPAM 5 MG PO TABS: 10.0000 mg | ORAL_TABLET | ORAL | Status: AC

## 2021-11-09 MED ORDER — SODIUM CHLORIDE 0.9 % IV SOLN: INTRAVENOUS | Status: DC

## 2021-11-09 MED ORDER — CEPHALEXIN 500 MG PO CAPS: 500.0000 mg | ORAL_CAPSULE | Freq: Once | ORAL | Status: AC

## 2021-11-09 MED ORDER — CEPHALEXIN 500 MG PO CAPS
ORAL_CAPSULE | ORAL | Status: AC
  Administered 2021-11-09: 500 mg via ORAL
  Filled 2021-11-09: qty 1

## 2021-11-09 MED ORDER — DIPHENHYDRAMINE HCL 25 MG PO CAPS
ORAL_CAPSULE | ORAL | Status: AC
  Administered 2021-11-09: 25 mg via ORAL
  Filled 2021-11-09: qty 1

## 2021-11-09 MED ORDER — DIPHENHYDRAMINE HCL 25 MG PO CAPS: 25.0000 mg | ORAL_CAPSULE | ORAL | Status: AC

## 2021-11-09 NOTE — Interval H&P Note (Signed)
History and Physical Interval Note:  11/09/2021 11:46 AM  Steve Lopez  has presented today for surgery, with the diagnosis of Right Nephrolithiasis.  The various methods of treatment have been discussed with the patient and family. After consideration of risks, benefits and other options for treatment, the patient has consented to  Procedure(s): EXTRACORPOREAL SHOCK WAVE LITHOTRIPSY (ESWL) (Right) as a surgical intervention.  The patient's history has been reviewed, patient examined, no change in status, stable for surgery.  I have reviewed the patient's chart and labs.  Questions were answered to the patient's satisfaction.    RRR CTAB  Hollice Espy

## 2021-11-09 NOTE — Discharge Instructions (Addendum)
See [facility] discharge instructions in chart. AMBULATORY SURGERY  DISCHARGE INSTRUCTIONS   The drugs that you were given will stay in your system until tomorrow so for the next 24 hours you should not:  Drive an automobile Make any legal decisions Drink any alcoholic beverage   You may resume regular meals tomorrow.  Today it is better to start with liquids and gradually work up to solid foods.  You may eat anything you prefer, but it is better to start with liquids, then soup and crackers, and gradually work up to solid foods.   Please notify your doctor immediately if you have any unusual bleeding, trouble breathing, redness and pain at the surgery site, drainage, fever, or pain not relieved by medication.    Additional Instructions:        Please contact your physician with any problems or Same Day Surgery at [phone], Monday through Friday 6 am to 4 pm, or [place] [facility] number at [phone].  

## 2021-11-14 ENCOUNTER — Telehealth: Payer: Self-pay | Admitting: Family Medicine

## 2021-11-14 NOTE — Telephone Encounter (Signed)
Patient called and was wanting to know when he was suppose to follow up with you post ESWL? I see you want him to have a follow up but not sure when.

## 2021-11-15 NOTE — Telephone Encounter (Signed)
LM for patient to call back.

## 2021-11-16 NOTE — Telephone Encounter (Signed)
Spoke with pt. Pt. Scheduled for follow up ? ?

## 2021-12-12 NOTE — Progress Notes (Incomplete)
12/12/21 ?1:11 PM  ? ?Steve Lopez ?11/15/1985 ?[id number] ? ?Referring provider:  ?Doreene Nest, NP ?[address] ?[place],  [place] [address] ?No chief complaint on file. ? ? ?Urological history  ?Right ureteral stone  ?- CT renal stone study on 11/03/2021 visualized A 1.5 cm diameter cystic appearing area is noted within the posterolateral aspect of the mid left kidney. A 3 mm nonobstructing renal calculus is seen within the anterior aspect of the mid left kidney. A 2 mm nonobstructing renal calculus is seen within the lower pole of the right kidney. A 4 mm obstructing renal calculus is seen within the proximal right ureter with moderate severity right-sided hydronephrosis and hydroureter. Bladder is unremarkable.  ?- KUB visualized right proximal ureteral stone on CT is not seen on the current exam. Additionally the punctate intrarenal calculi are not seen. ?- S/p ESWL on 11/09/2021 ? ?HPI: ?Steve Lopez is a 36 y.o.male who presents today for a 1 month follow-up with KUB.  ? ? ? ? ?PMH: ?Past Medical History:  ?Diagnosis Date  ? Arthritis   ? Depression   ? Insomnia   ? Tobacco abuse   ? ? ?Surgical History: ?Past Surgical History:  ?Procedure Laterality Date  ? EXTRACORPOREAL SHOCK WAVE LITHOTRIPSY Right 11/09/2021  ? Procedure: EXTRACORPOREAL SHOCK WAVE LITHOTRIPSY (ESWL);  Surgeon: Vanna Scotland, MD;  Location: [facility];  Service: Urology;  Laterality: Right;  ? ? ?Home Medications:  ?Allergies as of 12/13/2021   ?No Known Allergies ?  ? ?  ?Medication List  ?  ? ?  ? Accurate as of December 12, 2021  1:11 PM. If you have any questions, ask your nurse or doctor.  ?  ?  ? ?  ? ?cetirizine 10 MG tablet ?Commonly known as: ZYRTEC ?Take 1 tablet (10 mg total) by mouth at bedtime. For allergies. ?  ?diclofenac 75 MG EC tablet ?Commonly known as: VOLTAREN ?Take 1 tablet (75 mg total) by mouth 2 (two) times daily as needed for moderate pain. ?  ?fluticasone 50 MCG/ACT nasal spray ?Commonly known as: FLONASE ?Place  1 spray into both nostrils 2 (two) times daily. ?  ?imipramine 50 MG tablet ?Commonly known as: TOFRANIL ?TAKE ONE TABLET BY MOUTH AT BEDTIME FOR ANXIETY ?  ?ketorolac 10 MG tablet ?Commonly known as: TORADOL ?Take 10 mg by mouth 3 (three) times daily. ?  ?omeprazole 40 MG capsule ?Commonly known as: PRILOSEC ?Take 1 capsule (40 mg total) by mouth daily. For heartburn. ?  ?ondansetron 8 MG tablet ?Commonly known as: ZOFRAN ?Take by mouth. ?  ?oxyCODONE-acetaminophen 5-325 MG tablet ?Commonly known as: Percocet ?Take 1 tablet by mouth every 4 (four) hours as needed for severe pain. ?  ?tamsulosin 0.4 MG Caps capsule ?Commonly known as: FLOMAX ?Take 0.4 mg by mouth daily. ?  ?VITAMIN D PO ?Take 5,000 Units by mouth daily. ?  ? ?  ? ? ?Allergies:  ?No Known Allergies ? ?Family History: ?No family history on file. ? ?Social History:  reports that he has quit smoking. His smoking use included cigarettes. He has never used smokeless tobacco. He reports that he does not drink alcohol and does not use drugs. ? ? ?Physical Exam: ?There were no vitals taken for this visit.  ?Constitutional:  Alert and oriented, No acute distress. ?HEENT: [place] AT, moist mucus membranes.  Trachea midline, no masses. ?Cardiovascular: No clubbing, cyanosis, or edema. ?Respiratory: Normal respiratory effort, no increased work of breathing. ?Skin: No rashes, bruises or suspicious  lesions. ?Neurologic: Grossly intact, no focal deficits, moving all 4 extremities. ?Psychiatric: Normal mood and affect. ? ? ?Laboratory Data: ?Lab Results  ?Component Value Date  ? CREATININE 1.90 (H) 11/03/2021  ? ?Component ?    Latest Ref Rng 11/03/2021  ?Sodium ?    135 - 145 mmol/L 136   ?Potassium ?    3.5 - 5.1 mmol/L 4.1   ?Chloride ?    98 - 111 mmol/L 98   ?CO2 ?    22 - 32 mmol/L 31   ?Glucose ?    70 - 99 mg/dL 93   ?BUN ?    6 - 20 mg/dL 26 (H)   ?Creatinine ?    0.61 - 1.24 mg/dL 8.56 (H)   ?Total Bilirubin ?    0.3 - 1.2 mg/dL 0.6   ?Alkaline Phosphatase ?     38 - 126 U/L 56   ?AST ?    15 - 41 U/L 19   ?ALT ?    0 - 44 U/L 22   ?Total Protein ?    6.5 - 8.1 g/dL 7.6   ?Albumin ?    3.5 - 5.0 g/dL 4.2   ?Calcium ?    8.9 - 10.3 mg/dL 9.8   ?GFR, Estimated ?    >60 mL/min 47 (L)   ?Anion gap ?    5 - 15  7   ?  ?(H) High ?(L) Low ?Urinalysis ? ? ?Pertinent Imaging: ? ? ?Assessment & Plan:   ? ? ?No follow-ups on file. ? ? [facility] ?[address] ?[place], [place] [address] ?[phone] ? ?I,Kailey Littlejohn,acting as a Neurosurgeon for [employer], PA-C.,have documented all relevant documentation on the behalf of SHANNON MCGOWAN, PA-C,as directed by  [facility], PA-C while in the presence of SHANNON MCGOWAN, PA-C. ?

## 2021-12-13 ENCOUNTER — Encounter: Payer: Self-pay | Admitting: Urology

## 2021-12-13 ENCOUNTER — Other Ambulatory Visit: Payer: Self-pay

## 2021-12-13 ENCOUNTER — Ambulatory Visit: Payer: No Typology Code available for payment source | Admitting: Urology

## 2021-12-13 DIAGNOSIS — N200 Calculus of kidney: Secondary | ICD-10-CM

## 2022-09-17 HISTORY — PX: NECK SURGERY: SHX720

## 2022-12-19 IMAGING — CT CT RENAL STONE PROTOCOL
2 of 4 series · 15 of 46 positions shown, 17 images · non-contrast
Comparison: None.

CLINICAL DATA: Right flank pain.



[Series 2: stone full standard · axial · 0.75mm/px · z∈[-597,-92]mm · 12 of 111 slices shown, 14 images]
[im 5/111  soft-tissue]
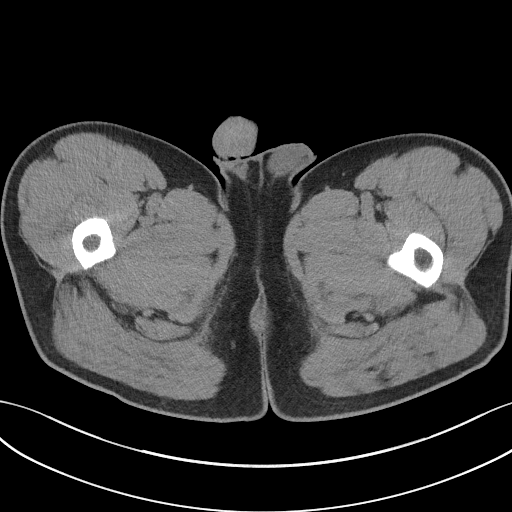
[im 5/111  bone]
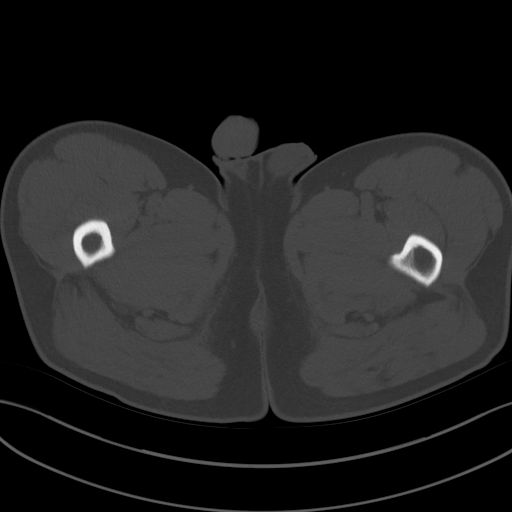
[im 14/111  soft-tissue]
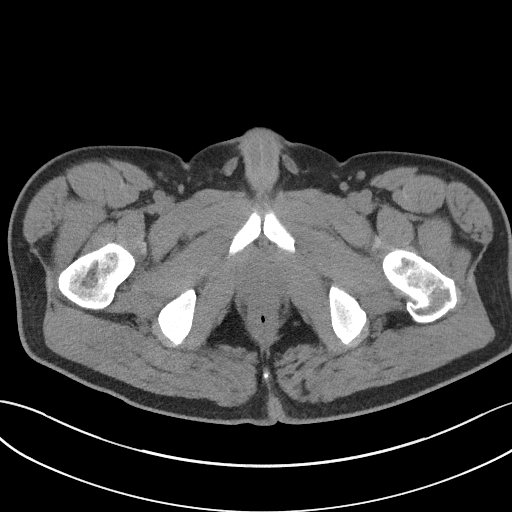
[im 23/111  soft-tissue]
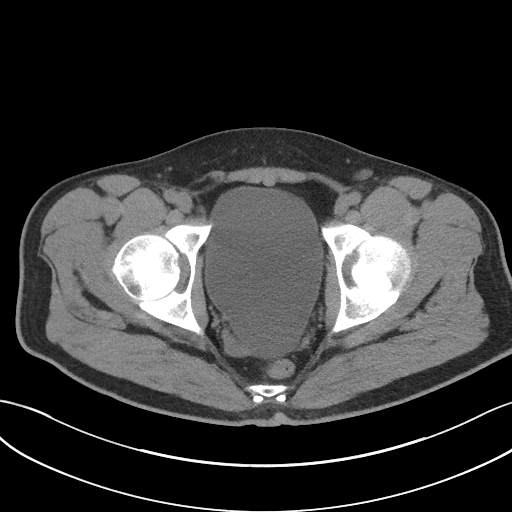
[im 33/111  soft-tissue]
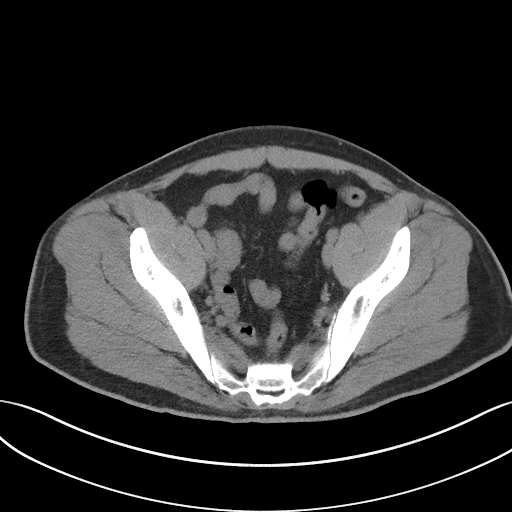
[im 42/111  soft-tissue]
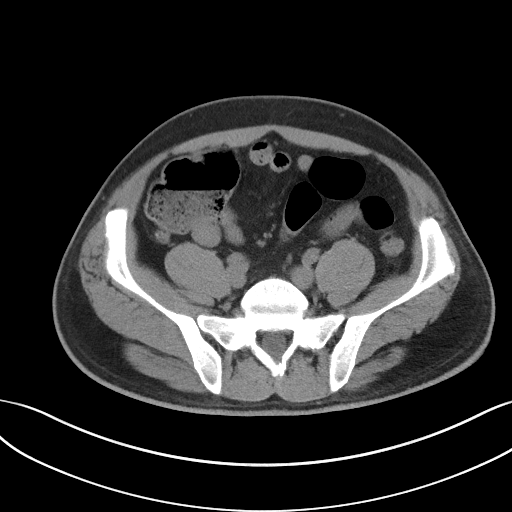
[im 51/111  soft-tissue]
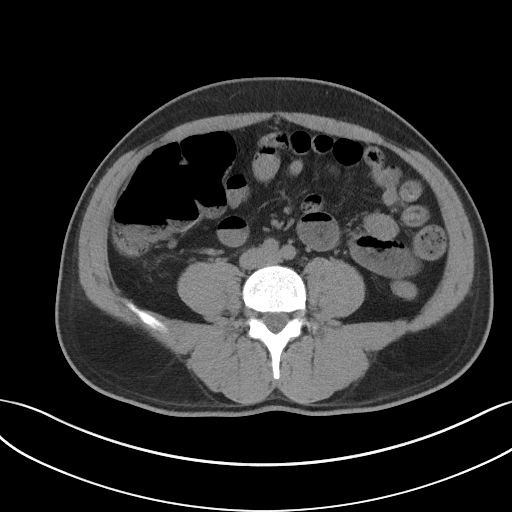
[im 60/111  soft-tissue]
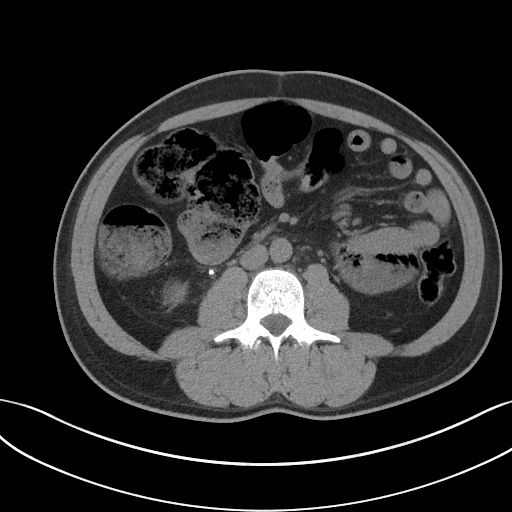
[im 69/111  soft-tissue]
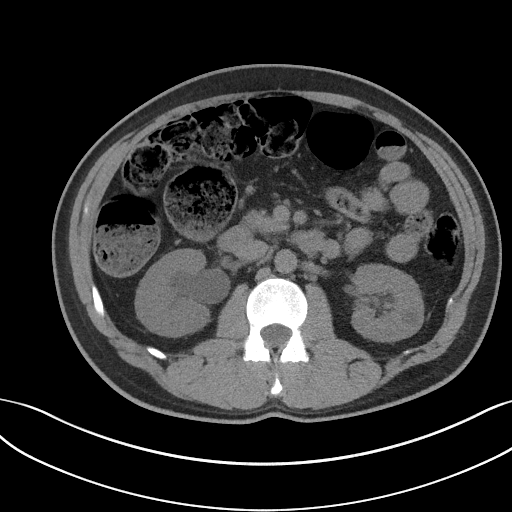
[im 78/111  soft-tissue]
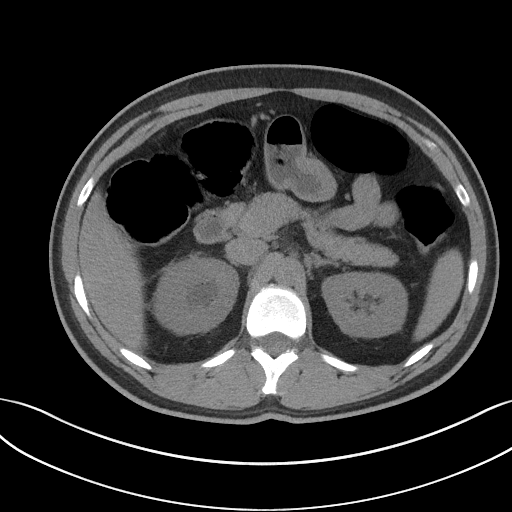
[im 78/111  bone]
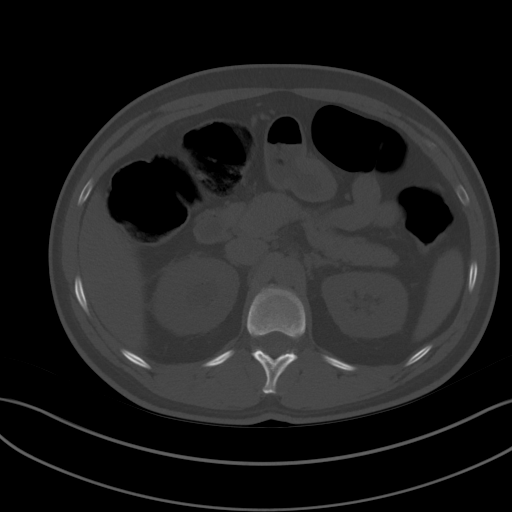
[im 88/111  soft-tissue]
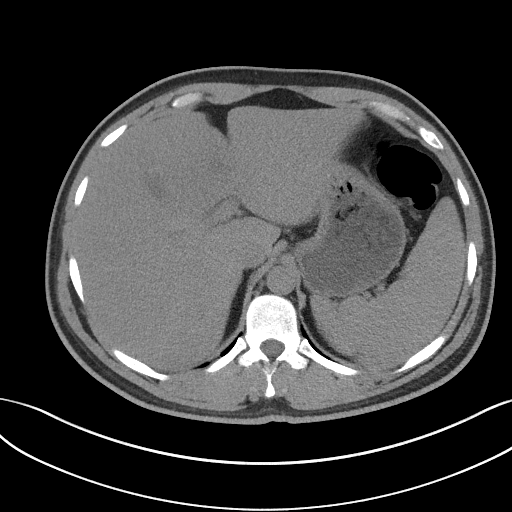
[im 97/111  soft-tissue]
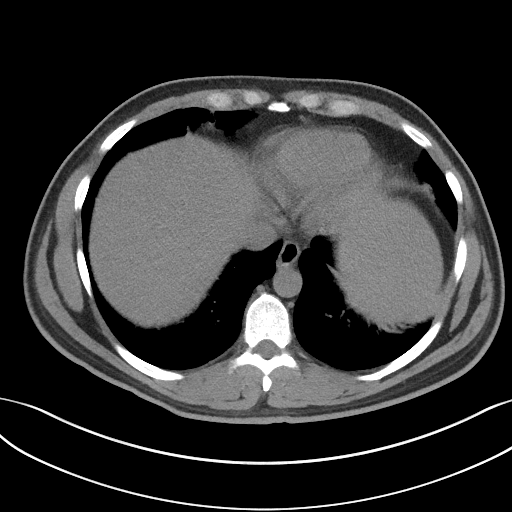
[im 106/111  soft-tissue]
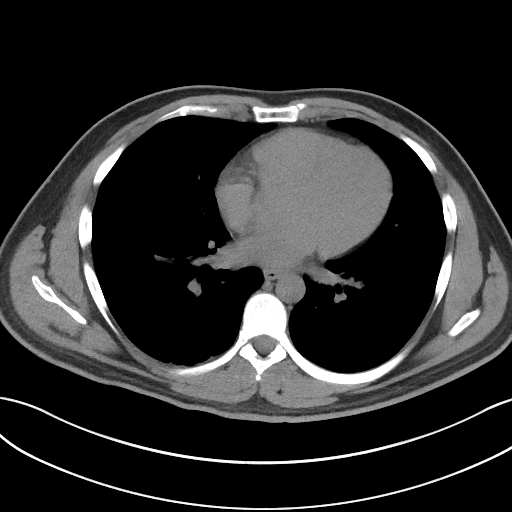

[Series 5: coronal · coronal · 0.71mm/px · 3 of 148 slices shown]
[im 50/148  soft-tissue]
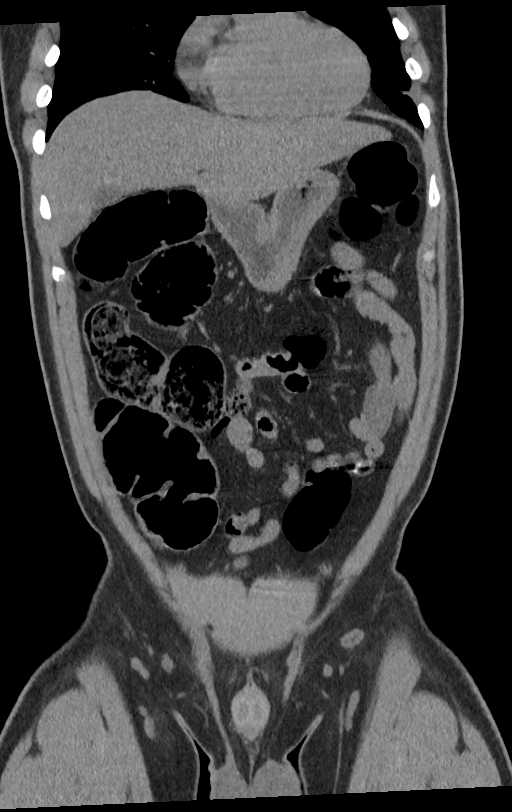
[im 66/148  soft-tissue]
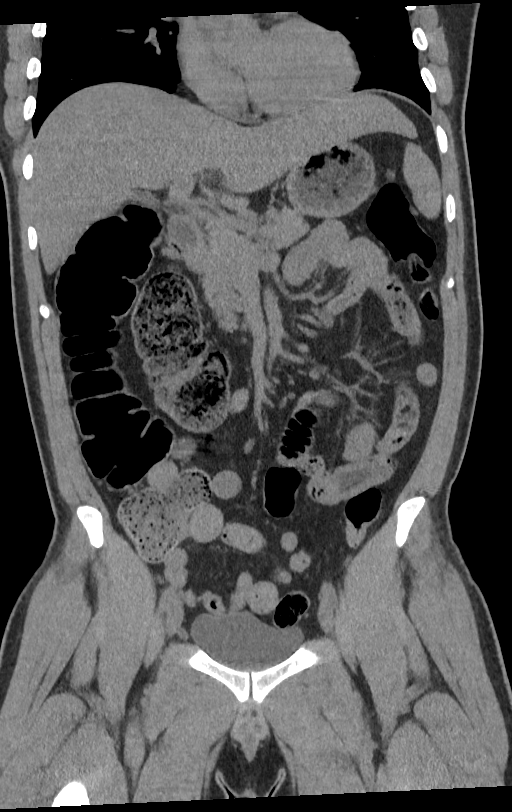
[im 82/148  soft-tissue]
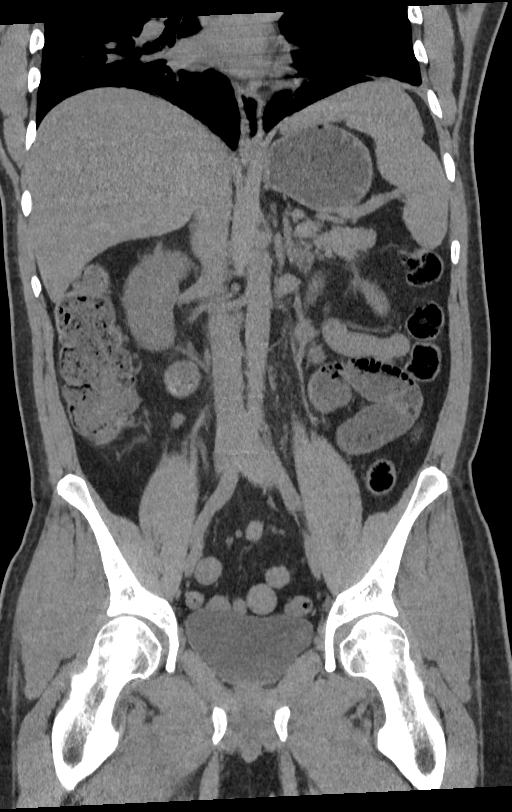

[15 of 46 positions shown; findings below may reference images not displayed]

FINDINGS: Lower chest: Mild linear scarring and/or atelectasis is seen within
the bilateral lung bases.

Hepatobiliary: No focal liver abnormality is seen. The gallbladder
is poorly visualized and may be contracted. There is no evidence of
biliary dilatation.

Pancreas: Unremarkable. No pancreatic ductal dilatation or
surrounding inflammatory changes.

Spleen: Normal in size without focal abnormality.

Adrenals/Urinary Tract: Adrenal glands are unremarkable. Kidneys are
normal in size. A 1.5 cm diameter cystic appearing area is noted
within the posterolateral aspect of the mid left kidney. A 3 mm
nonobstructing renal calculus is seen within the anterior aspect of
the mid left kidney. A 2 mm nonobstructing renal calculus is seen
within the lower pole of the right kidney. A 4 mm obstructing renal
calculus is seen within the proximal right ureter with moderate
severity right-sided hydronephrosis and hydroureter. Bladder is
unremarkable.

Stomach/Bowel: Stomach is within normal limits. Appendix appears
normal. No evidence of bowel wall thickening, distention, or
inflammatory changes.

Vascular/Lymphatic: No significant vascular findings are present. No
enlarged abdominal or pelvic lymph nodes.

Reproductive: Prostate is unremarkable.

Other: No abdominal wall hernia or abnormality. No abdominopelvic
ascites.

Musculoskeletal: No acute or significant osseous findings.
IMPRESSION: 1. 4 mm obstructing renal calculus within the proximal right ureter
with moderate severity right-sided hydronephrosis and hydroureter.
2. Bilateral nonobstructing renal calculi.

## 2022-12-19 IMAGING — CR DG ABDOMEN 1V
1 series · 2 of 2 positions shown · non-contrast
Comparison: Abdominal CT [DATE] hours ago

CLINICAL DATA: Right-sided abdominal pain. Evaluate right renal
stone.

EXAM:
ABDOMEN - 1 VIEW

[Series 1: dg abd 1 view · 0.14mm/px · 2 of 2 slices shown]
[im 1/2]
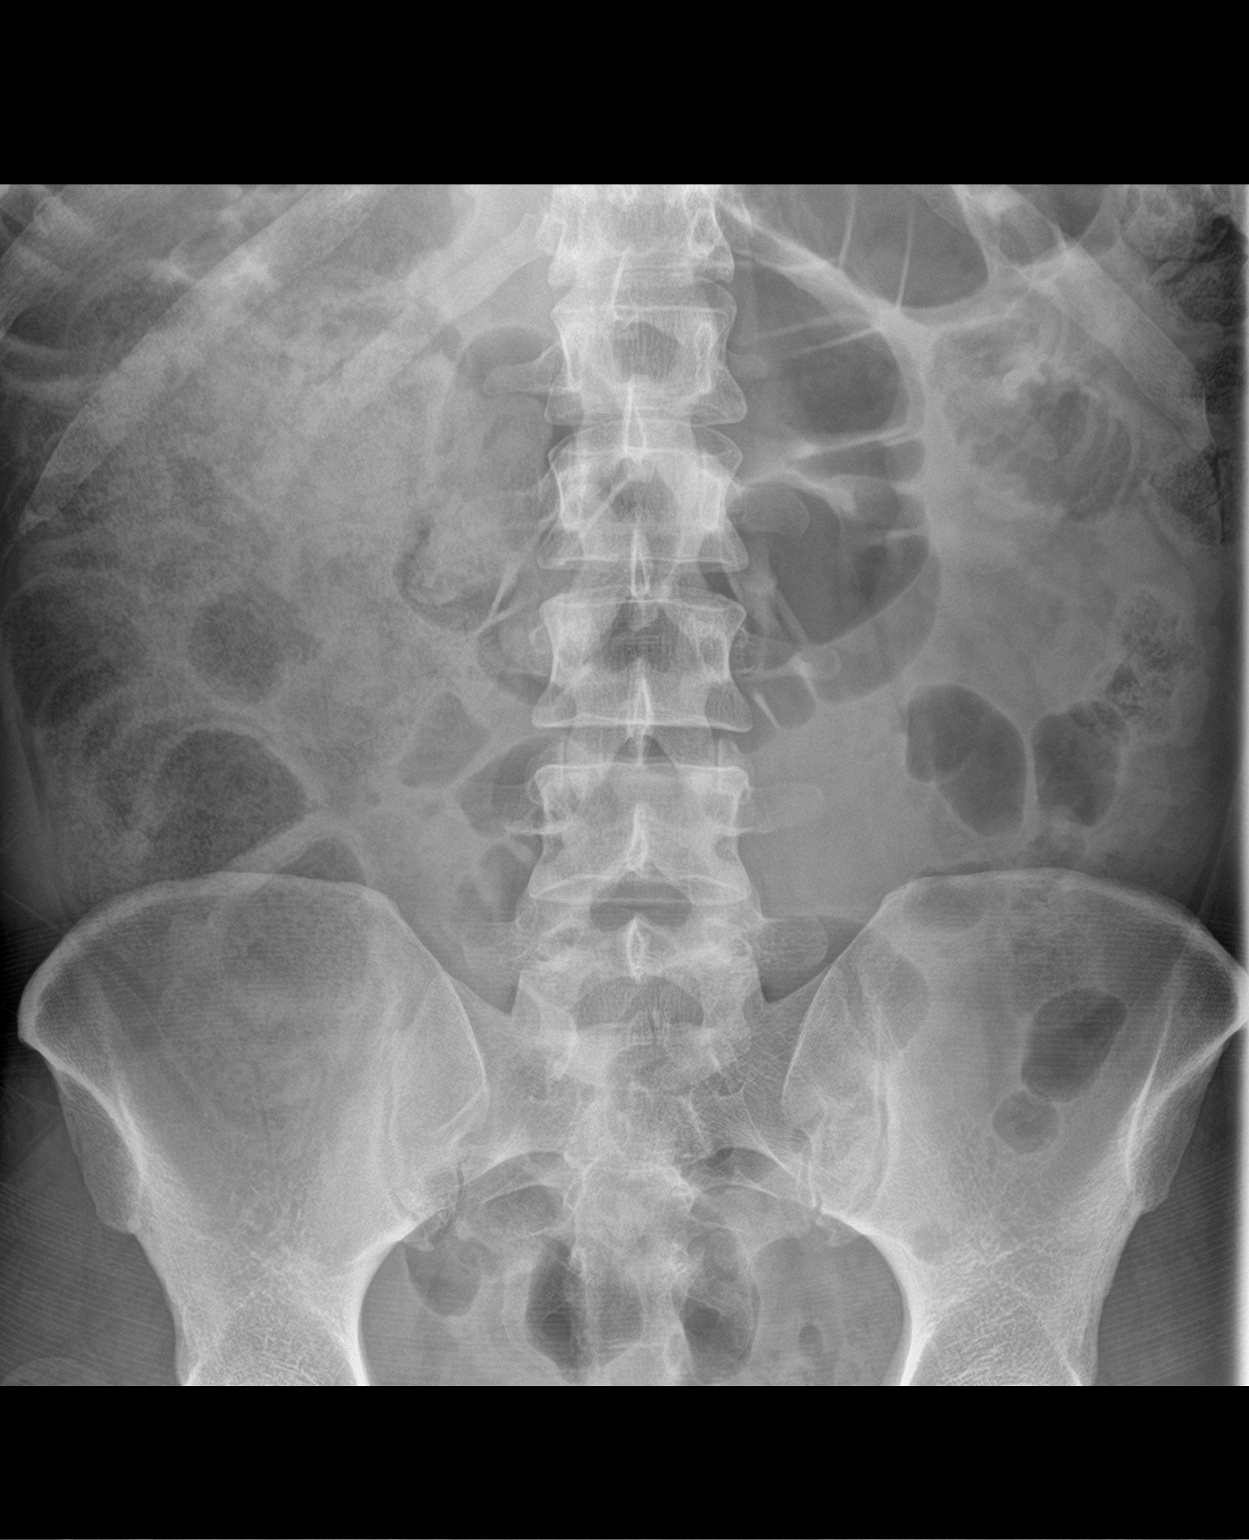
[im 2/2]
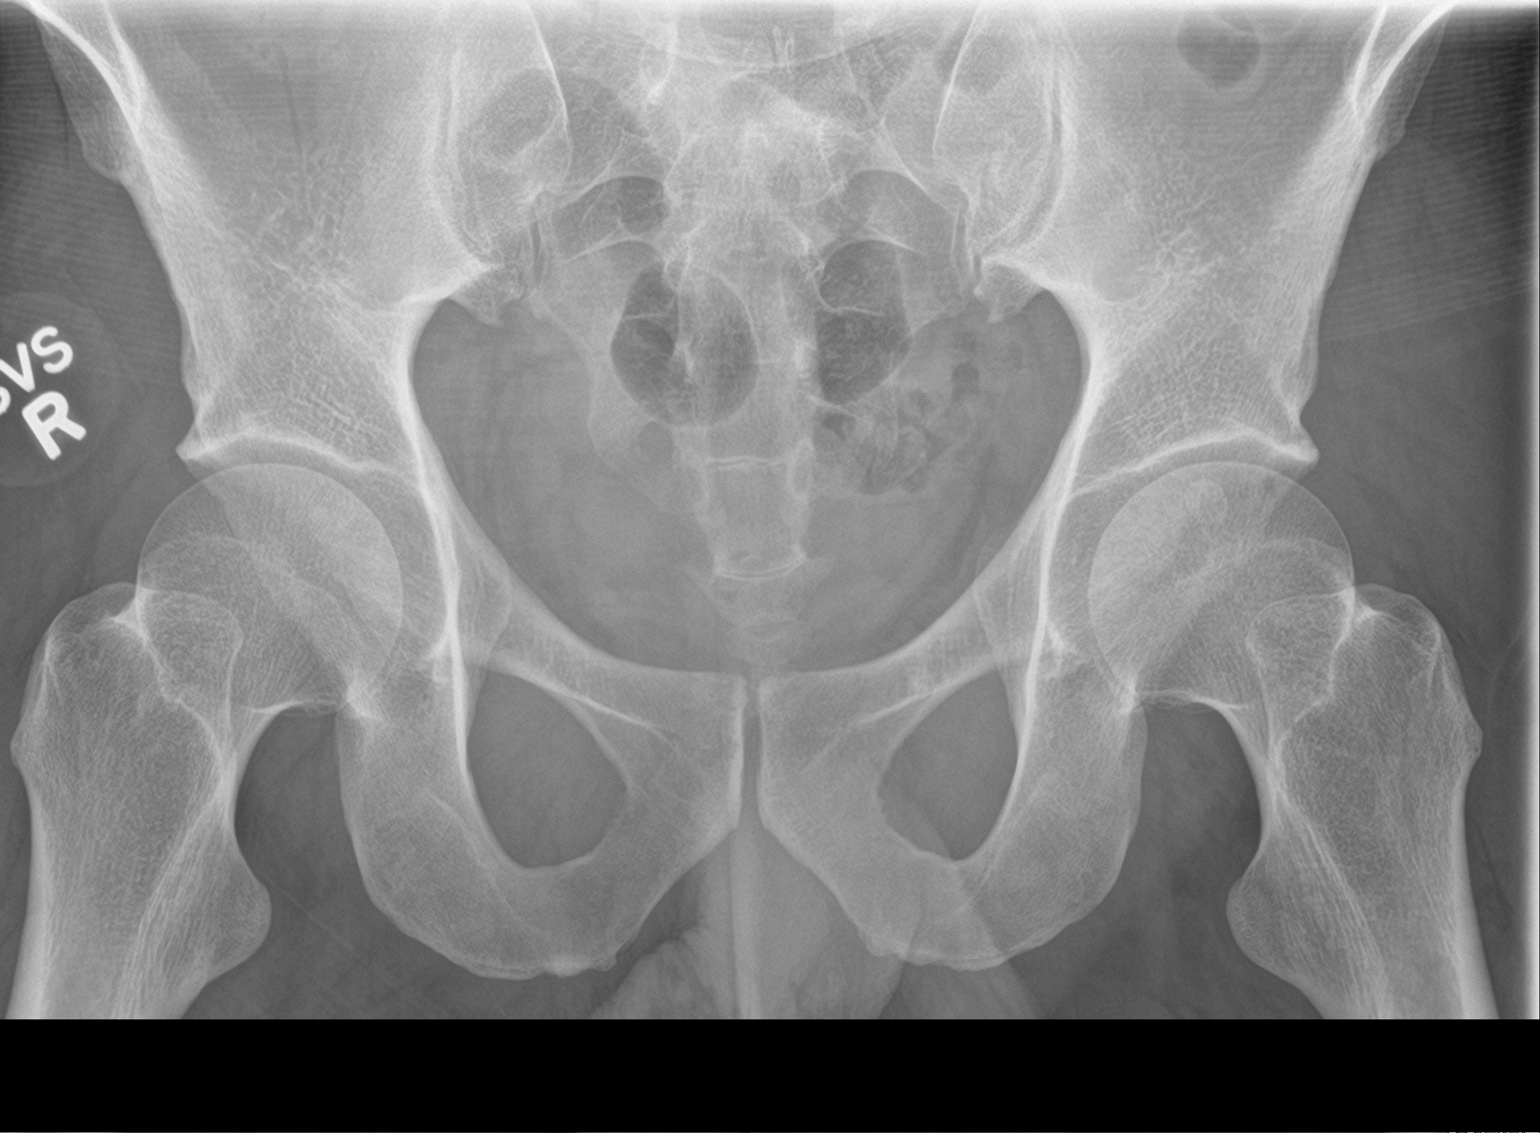

[2 of 2 positions shown; findings below may reference images not displayed]

FINDINGS: Right proximal ureteral stone on CT is not seen on the current exam,
large amount of stool and bowel gas obscure region of stone on CT.
No definite passage into the distal ureter. Punctate bilateral
intrarenal calculi on CT are also not seen. There is a large volume
of stool in the colon, particularly the right colon.
IMPRESSION: 1. Right proximal ureteral stone on CT is not seen on the current
exam. Additionally the punctate intrarenal calculi are not seen.
Technically limited radiographic evaluation due to stool and bowel
gas.
2. Large volume of stool in the colon.

## 2022-12-25 IMAGING — CR DG ABDOMEN 1V
1 series · 1 of 1 positions shown · non-contrast
Comparison: 11/03/2021

CLINICAL DATA: Kidney stone

EXAM:
ABDOMEN - 1 VIEW

[dg abd 1 view]
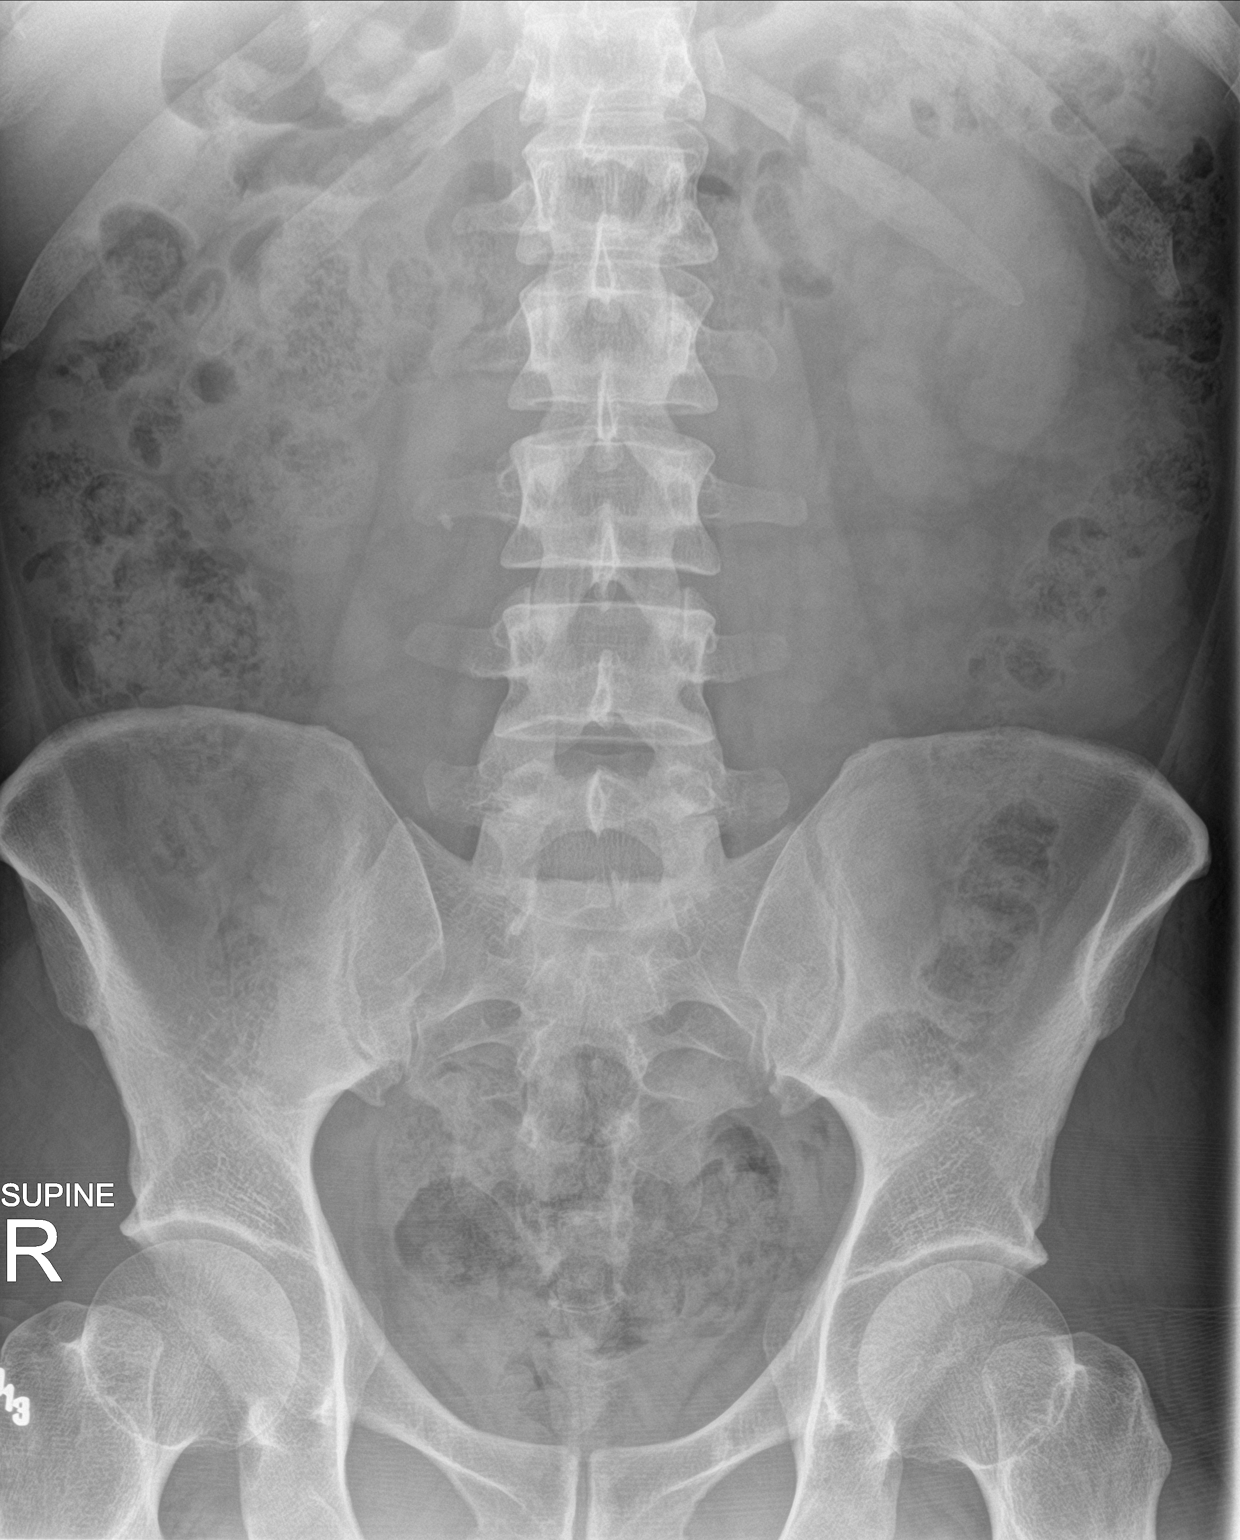

[1 of 1 positions shown; findings below may reference images not displayed]

FINDINGS: 4 mm calculus at expected position of RIGHT ureteropelvic junction
or proximal RIGHT ureter projects over the inferior margin of the
RIGHT transverse process L3.

Increased stool throughout colon.

Tiny nonobstructing calculus mid LEFT kidney 2 mm diameter.

Osseous structures unremarkable.
IMPRESSION: 4 mm RIGHT UPJ versus proximal ureteral calculus at the inferior
margin of the RIGHT transverse process L3.

2 mm nonobstructing LEFT renal calculus.

Increased stool throughout colon.

## 2024-04-13 ENCOUNTER — Ambulatory Visit
Admission: EM | Admit: 2024-04-13 | Discharge: 2024-04-13 | Disposition: A | Discharge status: Home / Self Care | Attending: Nurse Practitioner | Admitting: Nurse Practitioner

## 2024-04-13 DIAGNOSIS — U071 COVID-19: Secondary | ICD-10-CM | POA: Diagnosis not present

## 2024-04-13 DIAGNOSIS — N200 Calculus of kidney: Secondary | ICD-10-CM

## 2024-04-13 LAB — POC COVID19/FLU A&B COMBO
Covid Antigen, POC: POSITIVE — AB
Influenza A Antigen, POC: NEGATIVE
Influenza B Antigen, POC: NEGATIVE

## 2024-04-13 MED ORDER — FLUTICASONE PROPIONATE 50 MCG/ACT NA SUSP: 2.0000 | Freq: Every day | NASAL | 0 refills | Status: AC

## 2024-04-13 MED ORDER — PROMETHAZINE-DM 6.25-15 MG/5ML PO SYRP: 5.0000 mL | ORAL_SOLUTION | Freq: Four times a day (QID) | ORAL | 0 refills | Status: AC | PRN

## 2024-04-13 MED ORDER — PAXLOVID (150/100) 10 X 150 MG & 10 X 100MG PO TBPK: 2.0000 | ORAL_TABLET | Freq: Two times a day (BID) | ORAL | 0 refills | Status: AC

## 2024-04-13 NOTE — ED Provider Notes (Signed)
 [facility]    CSN: [id number] Arrival date & time: 04/13/24  1945      History   Chief Complaint Chief Complaint  Patient presents with   Cough    HPI Steve Lopez is a 38 y.o. male.   The history is provided by the patient.   Patient presents for complaints of bodyaches, headache, sore throat, nasal congestion, cough, and loss of smell that started earlier this morning.  Patient states that he has not had fever, chills, ear pain, ear drainage, wheezing, difficulty breathing, chest pain, abdominal pain, nausea, vomiting, diarrhea, or rash.  Patient denies any obvious known sick contacts.  He states that he did take Aleve for his symptoms.  Past Medical History:  Diagnosis Date   Arthritis    Depression    Insomnia    Tobacco abuse     Patient Active Problem List   Diagnosis Date Noted   Allergic rhinitis 12/03/2019   Elevated liver enzymes 12/03/2019   Chronic neck pain 08/11/2018   Tobacco abuse 04/28/2018   Preventative health care 06/11/2017   Chronic bilateral low back pain with sciatica 01/14/2017   Arthralgia 01/14/2017   Anxiety and depression 01/14/2017    Past Surgical History:  Procedure Laterality Date   EXTRACORPOREAL SHOCK WAVE LITHOTRIPSY Right 11/09/2021   Procedure: EXTRACORPOREAL SHOCK WAVE LITHOTRIPSY (ESWL);  Surgeon: Penne Knee, MD;  Location: [facility];  Service: Urology;  Laterality: Right;   NECK SURGERY  2024       Home Medications    Prior to Admission medications   Medication Sig Start Date End Date Taking? Authorizing Provider  cetirizine  (ZYRTEC ) 10 MG tablet Take 1 tablet (10 mg total) by mouth at bedtime. For allergies. Patient not taking: Reported on 11/09/2021 12/03/19   Clark, Katherine K, NP  Cholecalciferol (VITAMIN D PO) Take 5,000 Units by mouth daily. Patient not taking: Reported on 11/09/2021    [provider]  diclofenac  (VOLTAREN ) 75 MG EC tablet Take 1 tablet (75 mg total) by mouth 2 (two)  times daily as needed for moderate pain. Patient not taking: Reported on 11/09/2021 08/11/18   Clark, Katherine K, NP  fluticasone  (FLONASE ) 50 MCG/ACT nasal spray Place 1 spray into both nostrils 2 (two) times daily. Patient not taking: Reported on 11/09/2021 12/03/19   Clark, Katherine K, NP  imipramine (TOFRANIL) 50 MG tablet TAKE ONE TABLET BY MOUTH AT BEDTIME FOR ANXIETY Patient not taking: Reported on 11/09/2021 03/24/20   [provider]  ketorolac  (TORADOL ) 10 MG tablet Take 10 mg by mouth 3 (three) times daily. Patient not taking: Reported on 11/09/2021 10/15/21   [provider]  omeprazole  (PRILOSEC) 40 MG capsule Take 1 capsule (40 mg total) by mouth daily. For heartburn. Patient not taking: Reported on 11/09/2021 10/23/19   Gretta Comer POUR, NP  ondansetron  (ZOFRAN ) 8 MG tablet Take by mouth. 11/02/21   [provider]  tamsulosin (FLOMAX) 0.4 MG CAPS capsule Take 0.4 mg by mouth daily. 11/02/21   [provider]    Family History History reviewed. No pertinent family history.  Social History Social History   Tobacco Use   Smoking status: Former    Types: Cigarettes   Smokeless tobacco: Never  Substance Use Topics   Alcohol use: No   Drug use: No     Allergies   Patient has no known allergies.   Review of Systems Review of Systems Per HPI  Physical Exam Triage Vital Signs ED Triage Vitals [04/13/24  1957]  Encounter Vitals Group     BP (!) 144/85     Girls Systolic BP Percentile      Girls Diastolic BP Percentile      Boys Systolic BP Percentile      Boys Diastolic BP Percentile      Pulse Rate (!) 103     Resp 18     Temp 98.9 F (37.2 C)     Temp Source Oral     SpO2 98 %     Weight      Height      Head Circumference      Peak Flow      Pain Score 4     Pain Loc      Pain Education      Exclude from Growth Chart    No data found.  Updated Vital Signs BP (!) 144/85 (BP Location: Right Arm)   Pulse (!) 103   Temp  98.9 F (37.2 C) (Oral)   Resp 18   SpO2 98%   Visual Acuity Right Eye Distance:   Left Eye Distance:   Bilateral Distance:    Right Eye Near:   Left Eye Near:    Bilateral Near:     Physical Exam Vitals and nursing note reviewed.  Constitutional:      General: He is not in acute distress.    Appearance: Normal appearance.  HENT:     Head: Normocephalic.     Right Ear: Tympanic membrane, ear canal and external ear normal.     Left Ear: Tympanic membrane, ear canal and external ear normal.     Nose: Congestion present.     Right Turbinates: Enlarged and swollen.     Left Turbinates: Enlarged and swollen.     Right Sinus: No maxillary sinus tenderness or frontal sinus tenderness.     Left Sinus: No maxillary sinus tenderness or frontal sinus tenderness.     Mouth/Throat:     Lips: Pink.     Mouth: Mucous membranes are moist.     Pharynx: Posterior oropharyngeal erythema and postnasal drip present. No pharyngeal swelling, oropharyngeal exudate or uvula swelling.     Comments: Cobblestoning present to posterior oropharynx  Eyes:     Extraocular Movements: Extraocular movements intact.     Conjunctiva/sclera: Conjunctivae normal.     Pupils: Pupils are equal, round, and reactive to light.  Cardiovascular:     Rate and Rhythm: Normal rate and regular rhythm.     Pulses: Normal pulses.     Heart sounds: Normal heart sounds.  Pulmonary:     Effort: Pulmonary effort is normal. No respiratory distress.     Breath sounds: Normal breath sounds. No stridor. No wheezing, rhonchi or rales.  Abdominal:     General: Bowel sounds are normal.     Palpations: Abdomen is soft.  Musculoskeletal:     Cervical back: Normal range of motion.  Skin:    General: Skin is warm and dry.  Neurological:     General: No focal deficit present.     Mental Status: He is alert and oriented to person, place, and time.  Psychiatric:        Mood and Affect: Mood normal.        Behavior: Behavior  normal.      UC Treatments / Results  Labs (all labs ordered are listed, but only abnormal results are displayed) Labs Reviewed  POC COVID19/FLU A&B COMBO    EKG  Radiology No results found.  Procedures Procedures (including critical care time)  Medications Ordered in UC Medications - No data to display  Initial Impression / Assessment and Plan / UC Course  I have reviewed the triage vital signs and the nursing notes.  Pertinent labs & imaging results that were available during my care of the patient were reviewed by me and considered in my medical decision making (see chart for details).  COVID/flu test was positive for COVID.  Patient has elected to begin Paxlovid .  Will also provide symptomatic treatment with Promethazine  DM for his cough and fluticasone  50 mcg nasal spray.  Supportive care recommendations were provided and discussed with the patient to include fluids, rest, over-the-counter analgesics, use of normal saline nasal spray, and use of a humidifier at nighttime during sleep.  Discussed indications with patient regarding follow-up.  Patient was in agreement with this plan of care and verbalizes understanding.  All questions were answered.  Patient stable for discharge.  Work note was provided.   Final Clinical Impressions(s) / UC Diagnoses   Final diagnoses:  None   Discharge Instructions   None    ED Prescriptions   None    PDMP not reviewed this encounter.   Gilmer Etta PARAS, NP 04/13/24 2009

## 2024-04-13 NOTE — Discharge Instructions (Signed)
 You have tested positive for COVID.  The flu test was negative. Take medication as prescribed. Increase fluids and allow for plenty of rest. You may take over-the-counter Tylenol  as needed for pain, fever, or general discomfort. Recommend normal saline nasal spray throughout the day for nasal congestion and runny nose. For your cough, recommend use of a humidifier in your bedroom at nighttime to sleep and sleeping elevated on pillows also persist. You will need to wear your mask while you are taking the antiviral.  If you continue to experience symptoms after completing the antiviral, wear your mask for an additional 5 days. If you develop a fever, you will need to remain home until you have been fever free for 24 hours with no medication. If symptoms fail to improve or worsen, recommend follow-up with your PCP. Go to the emergency department immediately if you experience shortness of breath, difficulty breathing, or other concerns. Follow-up as needed.

## 2024-04-13 NOTE — ED Triage Notes (Signed)
 Pt reports he has a cough, nasal congestion,  body aches, sore throat, and loss his sense of smell earlier this morning Denies exposure  Took aleve
